# Patient Record
Sex: Female | Born: 1975 | Race: White | Hispanic: No | Marital: Married | Smoking: Former smoker
Health system: Southern US, Community
[De-identification: ages and names within clinical notes are randomized; demographics above are authoritative.]

## PROBLEM LIST (undated history)

## (undated) DIAGNOSIS — Z973 Presence of spectacles and contact lenses: Secondary | ICD-10-CM

## (undated) DIAGNOSIS — Z8489 Family history of other specified conditions: Secondary | ICD-10-CM

## (undated) DIAGNOSIS — Z789 Other specified health status: Secondary | ICD-10-CM

## (undated) DIAGNOSIS — Z860101 Personal history of adenomatous and serrated colon polyps: Secondary | ICD-10-CM

## (undated) DIAGNOSIS — Z86010 Personal history of colonic polyps: Secondary | ICD-10-CM

## (undated) DIAGNOSIS — N939 Abnormal uterine and vaginal bleeding, unspecified: Secondary | ICD-10-CM

## (undated) DIAGNOSIS — I82409 Acute embolism and thrombosis of unspecified deep veins of unspecified lower extremity: Secondary | ICD-10-CM

## (undated) HISTORY — PX: CHOLECYSTECTOMY: SHX55

---

## 2002-10-03 ENCOUNTER — Inpatient Hospital Stay (HOSPITAL_COMMUNITY): Admission: AD | Admit: 2002-10-03 | Discharge: 2002-10-05 | Payer: Self-pay | Admitting: *Deleted

## 2002-11-11 ENCOUNTER — Other Ambulatory Visit: Admission: RE | Admit: 2002-11-11 | Discharge: 2002-11-11 | Payer: Self-pay | Admitting: *Deleted

## 2003-07-30 ENCOUNTER — Encounter: Admission: RE | Admit: 2003-07-30 | Discharge: 2003-07-30 | Payer: Self-pay | Admitting: Internal Medicine

## 2003-12-29 ENCOUNTER — Other Ambulatory Visit: Admission: RE | Admit: 2003-12-29 | Discharge: 2003-12-29 | Payer: Self-pay | Admitting: Obstetrics and Gynecology

## 2005-01-03 ENCOUNTER — Other Ambulatory Visit: Admission: RE | Admit: 2005-01-03 | Discharge: 2005-01-03 | Payer: Self-pay | Admitting: Obstetrics and Gynecology

## 2008-04-21 ENCOUNTER — Encounter: Admission: RE | Admit: 2008-04-21 | Discharge: 2008-06-01 | Payer: Self-pay | Admitting: Orthopaedic Surgery

## 2010-10-06 ENCOUNTER — Other Ambulatory Visit (HOSPITAL_COMMUNITY): Payer: Self-pay | Admitting: Gastroenterology

## 2010-10-06 DIAGNOSIS — R110 Nausea: Secondary | ICD-10-CM

## 2010-10-10 ENCOUNTER — Other Ambulatory Visit: Payer: Self-pay | Admitting: Gastroenterology

## 2010-10-10 ENCOUNTER — Ambulatory Visit
Admission: RE | Admit: 2010-10-10 | Discharge: 2010-10-10 | Disposition: A | Discharge status: Home / Self Care | Payer: BC Managed Care – PPO | Source: Ambulatory Visit | Attending: Gastroenterology | Admitting: Gastroenterology

## 2010-10-10 DIAGNOSIS — R110 Nausea: Secondary | ICD-10-CM

## 2010-10-10 DIAGNOSIS — R634 Abnormal weight loss: Secondary | ICD-10-CM

## 2010-10-10 DIAGNOSIS — R109 Unspecified abdominal pain: Secondary | ICD-10-CM

## 2010-10-10 MED ORDER — IOHEXOL 300 MG/ML  SOLN
100.0000 mL | Freq: Once | INTRAMUSCULAR | Status: AC | PRN
  Administered 2010-10-10: 100 mL via INTRAVENOUS

## 2010-10-17 ENCOUNTER — Encounter (HOSPITAL_COMMUNITY)
Admission: RE | Admit: 2010-10-17 | Discharge: 2010-10-17 | Disposition: A | Discharge status: Home / Self Care | Payer: BC Managed Care – PPO | Source: Ambulatory Visit | Attending: Gastroenterology | Admitting: Gastroenterology

## 2010-10-17 DIAGNOSIS — R109 Unspecified abdominal pain: Secondary | ICD-10-CM | POA: Insufficient documentation

## 2010-10-17 DIAGNOSIS — R110 Nausea: Secondary | ICD-10-CM | POA: Insufficient documentation

## 2010-10-17 MED ORDER — TECHNETIUM TC 99M MEBROFENIN IV KIT
5.3000 | PACK | Freq: Once | INTRAVENOUS | Status: AC | PRN
  Administered 2010-10-17: 8.3 via INTRAVENOUS

## 2010-11-18 ENCOUNTER — Encounter (HOSPITAL_COMMUNITY)
Admission: RE | Admit: 2010-11-18 | Discharge: 2010-11-18 | Disposition: A | Discharge status: Home / Self Care | Payer: BC Managed Care – PPO | Source: Ambulatory Visit | Attending: General Surgery | Admitting: General Surgery

## 2010-11-18 LAB — DIFFERENTIAL
Basophils Absolute: 0.1 10*3/uL (ref 0.0–0.1)
Basophils Relative: 1 % (ref 0–1)
Eosinophils Absolute: 0.1 10*3/uL (ref 0.0–0.7)
Eosinophils Relative: 3 % (ref 0–5)
Lymphocytes Relative: 31 % (ref 12–46)
Lymphs Abs: 1.4 10*3/uL (ref 0.7–4.0)
Monocytes Absolute: 0.5 10*3/uL (ref 0.1–1.0)
Monocytes Relative: 10 % (ref 3–12)
Neutro Abs: 2.6 10*3/uL (ref 1.7–7.7)
Neutrophils Relative %: 56 % (ref 43–77)

## 2010-11-18 LAB — COMPREHENSIVE METABOLIC PANEL
ALT: 8 U/L (ref 0–35)
AST: 17 U/L (ref 0–37)
Albumin: 4.1 g/dL (ref 3.5–5.2)
Alkaline Phosphatase: 37 U/L — ABNORMAL LOW (ref 39–117)
BUN: 11 mg/dL (ref 6–23)
CO2: 27 mEq/L (ref 19–32)
Calcium: 9.9 mg/dL (ref 8.4–10.5)
Chloride: 104 mEq/L (ref 96–112)
Creatinine, Ser: 0.64 mg/dL (ref 0.4–1.2)
GFR calc Af Amer: >60 mL/min (ref >60)
GFR calc non Af Amer: >60 mL/min (ref >60)
Glucose, Bld: 84 mg/dL (ref 70–99)
Potassium: 4.4 mEq/L (ref 3.5–5.1)
Sodium: 139 mEq/L (ref 135–145)
Total Bilirubin: 1 mg/dL (ref 0.3–1.2)
Total Protein: 6.9 g/dL (ref 6.0–8.3)

## 2010-11-18 LAB — CBC
HCT: 38.5 % (ref 36.0–46.0)
Hemoglobin: 13.2 g/dL (ref 12.0–15.0)
MCH: 29.6 pg (ref 26.0–34.0)
MCHC: 34.3 g/dL (ref 30.0–36.0)
MCV: 86.3 fL (ref 78.0–100.0)
Platelets: 147 10*3/uL — ABNORMAL LOW (ref 150–400)
RBC: 4.46 MIL/uL (ref 3.87–5.11)
RDW: 12.5 % (ref 11.5–15.5)
WBC: 4.6 10*3/uL (ref 4.0–10.5)

## 2010-11-18 LAB — SURGICAL PCR SCREEN
MRSA, PCR: NEGATIVE
Staphylococcus aureus: NEGATIVE

## 2010-11-18 LAB — HCG, SERUM, QUALITATIVE: Preg, Serum: NEGATIVE

## 2010-11-22 ENCOUNTER — Other Ambulatory Visit: Payer: Self-pay | Admitting: General Surgery

## 2010-11-22 ENCOUNTER — Ambulatory Visit (HOSPITAL_COMMUNITY): Payer: BC Managed Care – PPO

## 2010-11-22 ENCOUNTER — Ambulatory Visit (HOSPITAL_COMMUNITY)
Admission: RE | Admit: 2010-11-22 | Discharge: 2010-11-22 | Disposition: A | Discharge status: Home / Self Care | Payer: BC Managed Care – PPO | Source: Ambulatory Visit | Attending: General Surgery | Admitting: General Surgery

## 2010-11-22 DIAGNOSIS — K824 Cholesterolosis of gallbladder: Secondary | ICD-10-CM | POA: Insufficient documentation

## 2010-11-22 DIAGNOSIS — Z01818 Encounter for other preprocedural examination: Secondary | ICD-10-CM | POA: Insufficient documentation

## 2010-11-22 DIAGNOSIS — Z87891 Personal history of nicotine dependence: Secondary | ICD-10-CM | POA: Insufficient documentation

## 2010-11-22 HISTORY — PX: CHOLECYSTECTOMY, LAPAROSCOPIC: SHX56

## 2010-11-29 NOTE — Op Note (Signed)
NAME:  Kathy Wu, Kathy Wu NO.:  [id number]  MEDICAL RECORD NO.:  [id number]  LOCATION:  [facility]                         FACILITY:  [facility]  PHYSICIAN:  Cherylynn Ridges, M.D.    DATE OF BIRTH:  02/04/1976  DATE OF PROCEDURE:  11/22/2010 DATE OF DISCHARGE:                              OPERATIVE REPORT   PREOPERATIVE DIAGNOSIS:  Gallbladder polyp with symptoms.  POSTOPERATIVE DIAGNOSIS:  Gallbladder polyp with symptoms.  PROCEDURE:  Laparoscopic cholecystectomy with cholangiogram.  SURGEON:  Cherylynn Ridges, MD  ANESTHESIA:  General endotracheal.  ESTIMATED BLOOD LOSS:  Less than 10 mL.  COMPLICATIONS:  None.  CONDITION:  Stable.  FINDINGS:  The patient had a normal intraoperative cholangiogram with some evidence of mild cholecystitis.  INDICATIONS FOR OPERATION:  The patient is a 35 year old with an ultrasound demonstrating a gallbladder polyp, symptoms of right upper quadrant pain not really related to the polyp it seems like but a gallbladder polyp noted on the ultrasound who now comes in for an elective laparoscopic cholecystectomy.  OPERATION:  The patient was taken to the operating room, placed on table in supine position.  After an adequate general endotracheal anesthetic was administered, she was prepped and draped in the usual sterile manner exposing the entire abdomen.  After a proper time-out was performed identifying the patient and the procedure to be performed an infraumbilical midline incision was made using #15 blade and taken down to the midline fascia.  The midline fascia with grabbed with Kocher clamps and we incised between the Kocher clamps using 15 blade into the preperitoneal space.  We grabbed the edges of the fascia as we bluntly dissected into the peritoneal cavity using Kelly clamp.  Once we were in the free peritoneal cavity a pursestring suture of 0 Vicryl was passed around the fascial opening, then a Hasson cannula passed into the  peritoneal cavity.  The Hasson cannula was secured in place using the pursestring suture which was in place.  We then insufflated carbon dioxide gas up to a maximal intra- abdominal pressure of 15 mmHg.  The patient was placed in reverse Trendelenburg position.  The left-side was tilted down.  To right costal margin 5-mm cannula and a subxiphoid 5- mm cannula were passed under direct vision.  Once all cannulae were placed the dissection was begun.  As we retracted on the dome of the gallbladder using a ratcheted grasper through the lateral-most cannula towards the anterior abdominal wall in right upper quadrant some filmy adhesions were noted to the midportion and infundibulum of the gallbladder which were dissected away using electrocautery and blunt dissection.  We got down to the peritoneum overlying the triangle of Calot and hepatoduodenal triangle and it was in those structures that we dissected out the cystic duct and the cystic artery.  The cystic duct was clipped along the gallbladder side.  A cholecystodochotomy made using laparoscopic scissors and then a cholangiogram performed using a Cook catheter which had been passed through the anterior abdominal wall.  The cholangiogram showed good flow into the duodenum, small common bile duct, good proximal filling and no obstructive process with no intraductal filling defects.  Once the cholangiogram  was completed, we removed the clip securing the catheter in place, removed the cath and then clipped the distal cystic duct x3 and transected it.  We then dissected out the cystic artery both anterior and posterior branches, clipped them proximally and distally, transected them, then dissected out the gallbladder from its bed with minimal difficulty.  We retrieved the gallbladder from the infraumbilical fascial site without an EndoCatch bag.  There was no leakage or spillage.  Once the gallbladder was retrieved, we irrigated the bed,  saw there was minimal bleeding and no bile staining.  We aspirated all fluid and gas from above the liver, then removed all cannulas.  The infraumbilical fascial site was closed using a pursestring suture which was in place.  We injected 1% Marcaine with epi at all sites.  We closed the skin at the umbilicus using running subcuticular stitch of 4- 0 Monocryl.  Dermabond, Steri-Strips and Tegaderm were used to complete all dressings.  All needle counts, sponge counts and instrument counts were correct.      Cherylynn Ridges, M.D.     JOW/MEDQ  D:  11/22/2010  T:  11/22/2010  Job:  [id number]  Electronically Signed by Jimmye Norman M.D. on 11/29/2010 08:19:46 AM

## 2010-12-08 ENCOUNTER — Encounter (INDEPENDENT_AMBULATORY_CARE_PROVIDER_SITE_OTHER): Payer: Self-pay | Admitting: General Surgery

## 2010-12-08 ENCOUNTER — Ambulatory Visit (INDEPENDENT_AMBULATORY_CARE_PROVIDER_SITE_OTHER): Payer: BC Managed Care – PPO | Admitting: General Surgery

## 2010-12-08 DIAGNOSIS — K811 Chronic cholecystitis: Secondary | ICD-10-CM

## 2010-12-08 NOTE — Progress Notes (Signed)
Subjective:     Patient ID: Kathy Wu, female   DOB: 1976-05-14, 35 y.o.   MRN: [id number]    There were no vitals taken for this visit.    HPI The patient is status post laparoscopic cholecystectomy doing well. She initially had difficulty eating habits to down a clear liquid diet for several day but eventually began to eat normally. She did develop some abdominal bloating subsequent which has subsequently resolved. Review of Systems Negative   Objective:   Physical Exam Peri0umbilical port site ecchymosis, no hernia.  Other sites okay.   Assessment:     Doing well postoperative S/P Lap. cholecystectomy    Plan:     No need for further followup Advance diet as tolerated.

## 2012-07-09 NOTE — H&P (Signed)
Kathy Wu  DICTATION # [id number] CSN# [id number]   Meriel Pica, MD 07/09/2012 9:06 AM

## 2012-07-09 NOTE — H&P (Signed)
NAME:  Kathy Wu, Kathy Wu NO.:  [id number]  MEDICAL RECORD NO.:  [id number]  LOCATION:  [facility]                         FACILITY:  [facility]  PHYSICIAN:  Duke Salvia. Marcelle Overlie, M.D.DATE OF BIRTH:  05/20/1976  DATE OF ADMISSION:  06/11/2012 DATE OF DISCHARGE:                             HISTORY & PHYSICAL   CHIEF COMPLAINT:  Pelvic pain/dyspareunia.  Right lower quadrant pain.  HISTORY OF PRESENT ILLNESS:  A 37 year old, G2, P1 having regular cycles, currently using condoms for contraception, presents with a 6-12 month history of right lower quadrant pain that is intermittently worse associated with dyspareunia.  Ultrasound in our office April 29, 2012 showed normal findings.  Her pain is worsened to the point that she desires to proceed with DL for further evaluation.  This procedure including risks related to bleeding, infection, other complications may require open additional surgery discussed with her which she understands and accepts.  PAST MEDICAL HISTORY:  ALLERGIES:  None.  CURRENT MEDICATIONS:  Motrin p.r.n. pain.  REVIEW OF SYSTEMS:  Significant for prior Pap.  She has had cryo of her Pap done, last Pap dated April 24, 2012 was normal.  OTHER SURGERIES:  Cholecystectomy in 2012, she has had a vaginal delivery in 2004.  FAMILY HISTORY:  Otherwise, unremarkable.  SOCIAL HISTORY:  Denies tobacco or drug use.  She does have 1 alcoholic drink per day.  She is married.  PHYSICAL EXAMINATION:  VITAL SIGNS:  Temperature 98.2, blood pressure 128/72. HEENT:  Unremarkable. NECK:  Supple without masses. LUNGS:  Clear. CARDIOVASCULAR:  Regular rate and rhythm without murmurs, rubs, or gallops noted. BREASTS:  Without masses. ABDOMEN:  Soft, flat, nontender. PELVIC:  Normal external genitalia.  Vagina and cervix clear.  Uterus, mid position, normal size, mobile, no unusual nodularity or tenderness. Slightly tender on the right.  No definite masses or  nodularity.  Left adnexa unremarkable. EXTREMITIES:  Unremarkable. NEUROLOGIC:  Unremarkable.  IMPRESSION:  Chronic pelvic pain, right lower quadrant pain, dyspareunia, rule out endometriosis, adhesions, or other causes for pain.  PLAN:  Diagnostic laparoscopy.  Procedure and risks reviewed as above.     Richard M. Marcelle Overlie, M.D.     [facility]/MEDQ  D:  07/09/2012  T:  07/09/2012  Job:  [id number]

## 2012-07-10 ENCOUNTER — Encounter (HOSPITAL_COMMUNITY): Payer: Self-pay | Admitting: Pharmacist

## 2012-07-11 ENCOUNTER — Encounter (HOSPITAL_COMMUNITY): Payer: Self-pay

## 2012-07-11 ENCOUNTER — Encounter (HOSPITAL_COMMUNITY)
Admission: RE | Admit: 2012-07-11 | Discharge: 2012-07-11 | Disposition: A | Discharge status: Home / Self Care | Payer: BC Managed Care – PPO | Source: Ambulatory Visit | Attending: Obstetrics and Gynecology | Admitting: Obstetrics and Gynecology

## 2012-07-11 HISTORY — DX: Other specified health status: Z78.9

## 2012-07-11 LAB — CBC
HCT: 41.5 % (ref 36.0–46.0)
Hemoglobin: 13.4 g/dL (ref 12.0–15.0)
MCH: 29.1 pg (ref 26.0–34.0)
MCHC: 32.3 g/dL (ref 30.0–36.0)
MCV: 90 fL (ref 78.0–100.0)
Platelets: 152 10*3/uL (ref 150–400)
RBC: 4.61 MIL/uL (ref 3.87–5.11)
RDW: 12.6 % (ref 11.5–15.5)
WBC: 4.7 10*3/uL (ref 4.0–10.5)

## 2012-07-11 LAB — SURGICAL PCR SCREEN
MRSA, PCR: NEGATIVE
Staphylococcus aureus: NEGATIVE

## 2012-07-11 NOTE — Patient Instructions (Addendum)
20 Kathy Wu  07/11/2012   Your procedure is scheduled on:  07/19/12  Enter through the Main Entrance of [facility] at 6 AM.  Pick up the phone at the desk and dial [phone].   Call this number if you have problems the morning of surgery: [phone]   Remember:   Do not eat food:After Midnight.  Do not drink clear liquids: After Midnight.  Take these medicines the morning of surgery with A SIP OF WATER: NA   Do not wear jewelry, make-up or nail polish.  Do not wear lotions, powders, or perfumes. You may wear deodorant.  Do not shave 48 hours prior to surgery.  Do not bring valuables to the hospital.  Contacts, dentures or bridgework may not be worn into surgery.  Leave suitcase in the car. After surgery it may be brought to your room.  For patients admitted to the hospital, checkout time is 11:00 AM the day of discharge.   Patients discharged the day of surgery will not be allowed to drive home.  Name and phone number of your driver: [name]  Husband  Special Instructions: Shower using CHG 2 nights before surgery and the night before surgery.  If you shower the day of surgery use CHG.  Use special wash - you have one bottle of CHG for all showers.  You should use approximately 1/3 of the bottle for each shower.   Please read over the following fact sheets that you were given: Surgical Site Infection Prevention

## 2012-07-13 DIAGNOSIS — Z86718 Personal history of other venous thrombosis and embolism: Secondary | ICD-10-CM

## 2012-07-13 HISTORY — DX: Personal history of other venous thrombosis and embolism: Z86.718

## 2012-07-19 ENCOUNTER — Encounter (HOSPITAL_COMMUNITY): Payer: Self-pay | Admitting: Anesthesiology

## 2012-07-19 ENCOUNTER — Ambulatory Visit (HOSPITAL_COMMUNITY)
Admission: RE | Admit: 2012-07-19 | Discharge: 2012-07-19 | Disposition: A | Discharge status: Home / Self Care | Payer: BC Managed Care – PPO | Source: Ambulatory Visit | Attending: Obstetrics and Gynecology | Admitting: Obstetrics and Gynecology

## 2012-07-19 ENCOUNTER — Encounter (HOSPITAL_COMMUNITY)
Admission: RE | Disposition: A | Discharge status: Home / Self Care | Payer: Self-pay | Source: Ambulatory Visit | Attending: Obstetrics and Gynecology

## 2012-07-19 ENCOUNTER — Ambulatory Visit (HOSPITAL_COMMUNITY): Payer: BC Managed Care – PPO | Admitting: Anesthesiology

## 2012-07-19 DIAGNOSIS — IMO0002 Reserved for concepts with insufficient information to code with codable children: Secondary | ICD-10-CM | POA: Insufficient documentation

## 2012-07-19 DIAGNOSIS — N949 Unspecified condition associated with female genital organs and menstrual cycle: Secondary | ICD-10-CM | POA: Insufficient documentation

## 2012-07-19 DIAGNOSIS — R1031 Right lower quadrant pain: Secondary | ICD-10-CM | POA: Insufficient documentation

## 2012-07-19 HISTORY — PX: LAPAROSCOPY: SHX197

## 2012-07-19 LAB — PREGNANCY, URINE: Preg Test, Ur: NEGATIVE

## 2012-07-19 SURGERY — LAPAROSCOPY, DIAGNOSTIC
Anesthesia: General | Site: Abdomen | Wound class: Clean

## 2012-07-19 MED ORDER — GLYCOPYRROLATE 0.2 MG/ML IJ SOLN
INTRAMUSCULAR | Status: AC
  Filled 2012-07-19: qty 3

## 2012-07-19 MED ORDER — PROPOFOL 10 MG/ML IV EMUL
INTRAVENOUS | Status: DC | PRN
  Administered 2012-07-19: 150 mg via INTRAVENOUS

## 2012-07-19 MED ORDER — SCOPOLAMINE 1 MG/3DAYS TD PT72
1.0000 | MEDICATED_PATCH | TRANSDERMAL | Status: DC
  Administered 2012-07-19: 1.5 mg via TRANSDERMAL

## 2012-07-19 MED ORDER — ONDANSETRON HCL 4 MG/2ML IJ SOLN
INTRAMUSCULAR | Status: AC
  Filled 2012-07-19: qty 2

## 2012-07-19 MED ORDER — FENTANYL CITRATE 0.05 MG/ML IJ SOLN
INTRAMUSCULAR | Status: AC
  Filled 2012-07-19: qty 5

## 2012-07-19 MED ORDER — MIDAZOLAM HCL 5 MG/5ML IJ SOLN
INTRAMUSCULAR | Status: DC | PRN
  Administered 2012-07-19: 2 mg via INTRAVENOUS

## 2012-07-19 MED ORDER — NEOSTIGMINE METHYLSULFATE 1 MG/ML IJ SOLN
INTRAMUSCULAR | Status: DC | PRN
  Administered 2012-07-19: 3 mg via INTRAVENOUS

## 2012-07-19 MED ORDER — ACETAMINOPHEN 10 MG/ML IV SOLN: 1000.0000 mg | Freq: Once | INTRAVENOUS | Status: DC

## 2012-07-19 MED ORDER — KETOROLAC TROMETHAMINE 30 MG/ML IJ SOLN
INTRAMUSCULAR | Status: DC | PRN
  Administered 2012-07-19: 30 mg via INTRAVENOUS

## 2012-07-19 MED ORDER — BUPIVACAINE HCL (PF) 0.25 % IJ SOLN
INTRAMUSCULAR | Status: DC | PRN
  Administered 2012-07-19: 8 mL

## 2012-07-19 MED ORDER — KETOROLAC TROMETHAMINE 30 MG/ML IJ SOLN: 15.0000 mg | Freq: Once | INTRAMUSCULAR | Status: DC | PRN

## 2012-07-19 MED ORDER — OXYCODONE-ACETAMINOPHEN 5-325 MG PO TABS: 1.0000 | ORAL_TABLET | ORAL | Status: DC | PRN

## 2012-07-19 MED ORDER — PROMETHAZINE HCL 25 MG/ML IJ SOLN: 6.2500 mg | INTRAMUSCULAR | Status: DC | PRN

## 2012-07-19 MED ORDER — DEXAMETHASONE SODIUM PHOSPHATE 10 MG/ML IJ SOLN
INTRAMUSCULAR | Status: AC
  Filled 2012-07-19: qty 1

## 2012-07-19 MED ORDER — BUPIVACAINE HCL (PF) 0.25 % IJ SOLN
INTRAMUSCULAR | Status: AC
  Filled 2012-07-19: qty 30

## 2012-07-19 MED ORDER — SCOPOLAMINE 1 MG/3DAYS TD PT72
MEDICATED_PATCH | TRANSDERMAL | Status: AC
  Administered 2012-07-19: 1.5 mg via TRANSDERMAL
  Filled 2012-07-19: qty 1

## 2012-07-19 MED ORDER — ONDANSETRON HCL 4 MG/2ML IJ SOLN
INTRAMUSCULAR | Status: DC | PRN
  Administered 2012-07-19: 4 mg via INTRAVENOUS

## 2012-07-19 MED ORDER — FENTANYL CITRATE 0.05 MG/ML IJ SOLN: 25.0000 ug | INTRAMUSCULAR | Status: DC | PRN

## 2012-07-19 MED ORDER — DEXAMETHASONE SODIUM PHOSPHATE 4 MG/ML IJ SOLN
INTRAMUSCULAR | Status: DC | PRN
  Administered 2012-07-19: 8 mg via INTRAVENOUS

## 2012-07-19 MED ORDER — NEOSTIGMINE METHYLSULFATE 1 MG/ML IJ SOLN
INTRAMUSCULAR | Status: AC
  Filled 2012-07-19: qty 1

## 2012-07-19 MED ORDER — MIDAZOLAM HCL 2 MG/2ML IJ SOLN
INTRAMUSCULAR | Status: AC
  Filled 2012-07-19: qty 2

## 2012-07-19 MED ORDER — GLYCOPYRROLATE 0.2 MG/ML IJ SOLN
INTRAMUSCULAR | Status: DC | PRN
  Administered 2012-07-19: 0.6 mg via INTRAVENOUS

## 2012-07-19 MED ORDER — MEPERIDINE HCL 25 MG/ML IJ SOLN: 6.2500 mg | INTRAMUSCULAR | Status: DC | PRN

## 2012-07-19 MED ORDER — ROCURONIUM BROMIDE 100 MG/10ML IV SOLN
INTRAVENOUS | Status: DC | PRN
  Administered 2012-07-19: 25 mg via INTRAVENOUS

## 2012-07-19 MED ORDER — LIDOCAINE HCL (CARDIAC) 20 MG/ML IV SOLN
INTRAVENOUS | Status: AC
  Filled 2012-07-19: qty 5

## 2012-07-19 MED ORDER — LACTATED RINGERS IV SOLN
INTRAVENOUS | Status: DC
  Administered 2012-07-19 (×2): via INTRAVENOUS

## 2012-07-19 MED ORDER — ROCURONIUM BROMIDE 50 MG/5ML IV SOLN
INTRAVENOUS | Status: AC
  Filled 2012-07-19: qty 1

## 2012-07-19 MED ORDER — MIDAZOLAM HCL 2 MG/2ML IJ SOLN: 0.5000 mg | Freq: Once | INTRAMUSCULAR | Status: DC | PRN

## 2012-07-19 MED ORDER — PROPOFOL 10 MG/ML IV EMUL
INTRAVENOUS | Status: AC
  Filled 2012-07-19: qty 20

## 2012-07-19 MED ORDER — ACETAMINOPHEN 10 MG/ML IV SOLN
INTRAVENOUS | Status: AC
  Administered 2012-07-19: 1000 mg via INTRAVENOUS
  Filled 2012-07-19: qty 100

## 2012-07-19 MED ORDER — LIDOCAINE HCL (CARDIAC) 20 MG/ML IV SOLN
INTRAVENOUS | Status: DC | PRN
  Administered 2012-07-19: 50 mg via INTRAVENOUS

## 2012-07-19 MED ORDER — SODIUM CHLORIDE 0.9 % IJ SOLN
INTRAMUSCULAR | Status: DC | PRN
  Administered 2012-07-19: 10 mL

## 2012-07-19 MED ORDER — FENTANYL CITRATE 0.05 MG/ML IJ SOLN
INTRAMUSCULAR | Status: DC | PRN
  Administered 2012-07-19: 100 ug via INTRAVENOUS

## 2012-07-19 SURGICAL SUPPLY — 26 items
CABLE HIGH FREQUENCY MONO STRZ (ELECTRODE) IMPLANT
CATH ROBINSON RED A/P 16FR (CATHETERS) ×3 IMPLANT
CLOTH BEACON ORANGE TIMEOUT ST (SAFETY) ×3 IMPLANT
DERMABOND ADVANCED (GAUZE/BANDAGES/DRESSINGS) ×1
DERMABOND ADVANCED .7 DNX12 (GAUZE/BANDAGES/DRESSINGS) ×2 IMPLANT
GLOVE BIO SURGEON STRL SZ7 (GLOVE) ×6 IMPLANT
GLOVE BIOGEL PI IND STRL 6.5 (GLOVE) IMPLANT
GLOVE BIOGEL PI INDICATOR 6.5 (GLOVE)
GOWN PREVENTION PLUS LG XLONG (DISPOSABLE) ×6 IMPLANT
NEEDLE INSUFFLATION 120MM (ENDOMECHANICALS) ×3 IMPLANT
NS IRRIG 1000ML POUR BTL (IV SOLUTION) ×3 IMPLANT
PACK LAPAROSCOPY BASIN (CUSTOM PROCEDURE TRAY) ×3 IMPLANT
POUCH SPECIMEN RETRIEVAL 10MM (ENDOMECHANICALS) IMPLANT
PROTECTOR NERVE ULNAR (MISCELLANEOUS) ×3 IMPLANT
SEALER TISSUE G2 CVD JAW 35 (ENDOMECHANICALS) IMPLANT
SEALER TISSUE G2 CVD JAW 45CM (ENDOMECHANICALS) IMPLANT
SET IRRIG TUBING LAPAROSCOPIC (IRRIGATION / IRRIGATOR) IMPLANT
SUT MON AB 2-0 CT1 36 (SUTURE) ×9 IMPLANT
SUT VIC AB 2-0 CT1 18 (SUTURE) ×6 IMPLANT
SUT VICRYL 0 TIES 12 18 (SUTURE) ×3 IMPLANT
SUT VICRYL RAPIDE 4/0 PS 2 (SUTURE) ×3 IMPLANT
TOWEL OR 17X24 6PK STRL BLUE (TOWEL DISPOSABLE) ×6 IMPLANT
TROCAR XCEL DIL TIP R 11M (ENDOMECHANICALS) IMPLANT
TROCAR Z-THREAD BLADED 5X100MM (TROCAR) ×6 IMPLANT
WARMER LAPAROSCOPE (MISCELLANEOUS) ×3 IMPLANT
WATER STERILE IRR 1000ML POUR (IV SOLUTION) IMPLANT

## 2012-07-19 NOTE — Anesthesia Preprocedure Evaluation (Signed)
Anesthesia Evaluation  Patient identified by MRN, date of birth, ID band Patient awake    Reviewed: Allergy & Precautions, H&P , Patient's Chart, lab work & pertinent test results, reviewed documented beta blocker date and time   History of Anesthesia Complications Negative for: history of anesthetic complications  Airway Mallampati: II TM Distance: >3 FB Neck ROM: full    Dental No notable dental hx.    Pulmonary neg pulmonary ROS,  breath sounds clear to auscultation  Pulmonary exam normal       Cardiovascular Exercise Tolerance: Good negative cardio ROS  Rhythm:regular Rate:Normal     Neuro/Psych negative neurological ROS  negative psych ROS   GI/Hepatic negative GI ROS, Neg liver ROS,   Endo/Other  negative endocrine ROS  Renal/GU negative Renal ROS     Musculoskeletal   Abdominal   Peds  Hematology negative hematology ROS (+)   Anesthesia Other Findings   Reproductive/Obstetrics negative OB ROS                           Anesthesia Physical Anesthesia Plan  ASA: I  Anesthesia Plan: General ETT   Post-op Pain Management:    Induction:   Airway Management Planned:   Additional Equipment:   Intra-op Plan:   Post-operative Plan:   Informed Consent: I have reviewed the patients History and Physical, chart, labs and discussed the procedure including the risks, benefits and alternatives for the proposed anesthesia with the patient or authorized representative who has indicated his/her understanding and acceptance.   Dental Advisory Given  Plan Discussed with: CRNA and Surgeon  Anesthesia Plan Comments:        Anesthesia Quick Evaluation  

## 2012-07-19 NOTE — Op Note (Signed)
Preoperative diagnosis: Chronic pelvic pain, right lower quadrant  Postoperative diagnosis: Same  Procedure: Diagnostic laparoscopy  Surgeon: Marcelle Overlie  EBL: Less than 10 cc  Specimens removed: None  Procedure and findings:  The patient taken the operating room after an adequate level of general endotracheal anesthesia was obtained the legs in stirrups the abdomen, perineum and vagina were prepped and draped in usual fashion for laparoscopy the bladder was drained, Hulka tenaculum was positioned. Appropriate timeout for taken part of this point.    Attention directed to the subumbilical area where the tissue was infiltrated with quarter some Marcaine plain small incision was made in the varies needle was introduced that difficulty. Its intra-abdominal position was verified by pressure water testing. After 2-1/2 L pneumoperitoneum syncopated lap scopic trocar and sleeve were then introduced that difficulty this is a 5 mm trocar. The patient in Trendelenburg 3 finger breaths above the symphysis in the midline and another 5 mm trocar was inserted under direct visualization a blunt probe was used. Pelvic findings as follows  The anterior posterior cul-de-sac were free and clear the uterus itself was normal size the serosa appeared to be normal bilateral adnexa unremarkable the appendix and upper abdomen were otherwise normal specifically careful inspection of all peritoneum did not reveal any early stage endometriosis the course of ureters on each side appeared be normal no other obvious explanation for her right lower quadrant pain. Careful inspection the appendix revealed it to be a nonadherent and otherwise appeared normal after these findings for further documented instruments removed removed gas allowed to escape defect closed with 4 Dexon subcuticular sutures and Dermabond she tolerated this well and went to recovery room in good condition.  Dictated with [facility]  Richard M. Milana Obey.D.

## 2012-07-19 NOTE — Transfer of Care (Signed)
Immediate Anesthesia Transfer of Care Note  Patient: Kathy Wu  Procedure(s) Performed: Procedure(s) (LRB) with comments: LAPAROSCOPY DIAGNOSTIC (N/A)  Patient Location: PACU  Anesthesia Type:General  Level of Consciousness: awake, alert  and oriented  Airway & Oxygen Therapy: Patient Spontanous Breathing and Patient connected to nasal cannula oxygen  Post-op Assessment: Report given to PACU RN and Post -op Vital signs reviewed and stable  Post vital signs: stable  Complications: No apparent anesthesia complications

## 2012-07-19 NOTE — Anesthesia Postprocedure Evaluation (Signed)
Anesthesia Post Note  Patient: Kathy Wu  Procedure(s) Performed: Procedure(s) (LRB): LAPAROSCOPY DIAGNOSTIC (N/A)  Anesthesia type: General  Patient location: PACU  Post pain: Pain level controlled  Post assessment: Post-op Vital signs reviewed  Last Vitals:  Filed Vitals:   07/19/12 0900  BP: 99/54  Pulse: 74  Temp: 37.1 C  Resp: 16    Post vital signs: Reviewed  Level of consciousness: sedated  Complications: No apparent anesthesia complications

## 2012-07-19 NOTE — Progress Notes (Signed)
The patient was re-examined with no change in status 

## 2012-07-21 ENCOUNTER — Encounter (HOSPITAL_COMMUNITY): Payer: Self-pay | Admitting: Anesthesiology

## 2012-07-21 ENCOUNTER — Inpatient Hospital Stay (EMERGENCY_DEPARTMENT_HOSPITAL)
Admission: AD | Admit: 2012-07-21 | Discharge: 2012-07-21 | Disposition: A | Discharge status: Hospice | Payer: BC Managed Care – PPO | Source: Ambulatory Visit | Attending: Obstetrics and Gynecology | Admitting: Obstetrics and Gynecology

## 2012-07-21 ENCOUNTER — Inpatient Hospital Stay (HOSPITAL_COMMUNITY)
Admission: EM | Admit: 2012-07-21 | Discharge: 2012-07-25 | DRG: 560 | Disposition: A | Discharge status: Home / Self Care | Payer: BC Managed Care – PPO | Attending: Internal Medicine | Admitting: Internal Medicine

## 2012-07-21 ENCOUNTER — Encounter (HOSPITAL_COMMUNITY): Payer: Self-pay | Admitting: Nurse Practitioner

## 2012-07-21 DIAGNOSIS — H538 Other visual disturbances: Secondary | ICD-10-CM | POA: Diagnosis present

## 2012-07-21 DIAGNOSIS — R1031 Right lower quadrant pain: Secondary | ICD-10-CM | POA: Diagnosis present

## 2012-07-21 DIAGNOSIS — M6281 Muscle weakness (generalized): Secondary | ICD-10-CM

## 2012-07-21 DIAGNOSIS — I81 Portal vein thrombosis: Secondary | ICD-10-CM | POA: Diagnosis present

## 2012-07-21 DIAGNOSIS — K269 Duodenal ulcer, unspecified as acute or chronic, without hemorrhage or perforation: Secondary | ICD-10-CM | POA: Diagnosis present

## 2012-07-21 DIAGNOSIS — R29898 Other symptoms and signs involving the musculoskeletal system: Principal | ICD-10-CM

## 2012-07-21 LAB — COMPREHENSIVE METABOLIC PANEL
ALT: 8 U/L (ref 0–35)
AST: 15 U/L (ref 0–37)
Albumin: 3.7 g/dL (ref 3.5–5.2)
Alkaline Phosphatase: 30 U/L — ABNORMAL LOW (ref 39–117)
BUN: 13 mg/dL (ref 6–23)
CO2: 28 mEq/L (ref 19–32)
Calcium: 9.1 mg/dL (ref 8.4–10.5)
Chloride: 102 mEq/L (ref 96–112)
Creatinine, Ser: 0.65 mg/dL (ref 0.50–1.10)
GFR calc Af Amer: >90 mL/min (ref >90)
GFR calc non Af Amer: >90 mL/min (ref >90)
Glucose, Bld: 72 mg/dL (ref 70–99)
Potassium: 3.7 mEq/L (ref 3.5–5.1)
Sodium: 140 mEq/L (ref 135–145)
Total Bilirubin: 0.5 mg/dL (ref 0.3–1.2)
Total Protein: 6.8 g/dL (ref 6.0–8.3)

## 2012-07-21 LAB — URINALYSIS, ROUTINE W REFLEX MICROSCOPIC
Glucose, UA: NEGATIVE mg/dL
Hgb urine dipstick: NEGATIVE
Ketones, ur: 40 mg/dL — AB
Leukocytes, UA: NEGATIVE
Nitrite: NEGATIVE
Protein, ur: NEGATIVE mg/dL
Specific Gravity, Urine: 1.025 (ref 1.005–1.030)
Urobilinogen, UA: 0.2 mg/dL (ref 0.0–1.0)
pH: 6 (ref 5.0–8.0)

## 2012-07-21 LAB — CBC WITH DIFFERENTIAL/PLATELET
Basophils Absolute: 0 10*3/uL (ref 0.0–0.1)
Basophils Relative: 1 % (ref 0–1)
Eosinophils Absolute: 0.1 10*3/uL (ref 0.0–0.7)
Eosinophils Relative: 2 % (ref 0–5)
HCT: 36.8 % (ref 36.0–46.0)
Hemoglobin: 12.6 g/dL (ref 12.0–15.0)
Lymphocytes Relative: 25 % (ref 12–46)
Lymphs Abs: 1.7 10*3/uL (ref 0.7–4.0)
MCH: 29.8 pg (ref 26.0–34.0)
MCHC: 34.2 g/dL (ref 30.0–36.0)
MCV: 87 fL (ref 78.0–100.0)
Monocytes Absolute: 0.5 10*3/uL (ref 0.1–1.0)
Monocytes Relative: 7 % (ref 3–12)
Neutro Abs: 4.4 10*3/uL (ref 1.7–7.7)
Neutrophils Relative %: 65 % (ref 43–77)
Platelets: 144 10*3/uL — ABNORMAL LOW (ref 150–400)
RBC: 4.23 MIL/uL (ref 3.87–5.11)
RDW: 12.1 % (ref 11.5–15.5)
WBC: 6.7 10*3/uL (ref 4.0–10.5)

## 2012-07-21 MED ORDER — OXYCODONE-ACETAMINOPHEN 5-325 MG PO TABS
1.0000 | ORAL_TABLET | ORAL | Status: DC | PRN
  Administered 2012-07-21: 1 via ORAL
  Filled 2012-07-21: qty 1

## 2012-07-21 NOTE — MAU Note (Signed)
States she had the scopalamine patch on and thought the dizziness was from that so she removed it Saturday.  States she has to lift her legs up to walk.  Denies headaches, reports nausea.

## 2012-07-21 NOTE — ED Notes (Signed)
Patient currently resting quietly in bed; no respiratory or acute distress noted.  Patient updated on plan of care; informed patient that we are currently waiting on lab results to come back.  Patient denies any other needs at this time; will continue to monitor.

## 2012-07-21 NOTE — ED Notes (Addendum)
Patient currently sitting up in bed; no respiratory or acute distress noted.  Patient pending EDP exam at this time.  Patient given warm blanket, per request.  Denies any other needs at this time; will continue to monitor.

## 2012-07-21 NOTE — ED Notes (Signed)
Neurologist currently at bedside

## 2012-07-21 NOTE — H&P (Signed)
[facility] History and Physical  Kathy Wu WUX:[id number] DOB: Oct 27, 1975 DOA: 07/21/2012  Referring physician: ED PCP: Provider Not In System  Specialists: Jerene Bears  Chief Complaint: Weakness, blurry vision  HPI: Kathy Wu is a 37 y.o. female who presents with c/o BLE weakness and blurry vision.  Symptoms onset after an ex-lap when waking up from sedation on Friday.  She describes severe BLE weakness located in her lower legs, and vision more blurry than baseline.  In the ED neurology was consulted and saw the patient, they were mostly unimpressed but did want to get an MRI of her C and T spine to rule out neuromyelitis optica but they did not feel that this needed to be done emergently.  Because [facility] does not do non-emergent MRIs at night, admitting patient to obs to check MRI in AM.  Review of Systems: 12 systems reviewed and otherwise negative.  Past Medical History  Diagnosis Date  . No pertinent past medical history    Past Surgical History  Procedure Laterality Date  . Cholecystectomy     Social History:  reports that she quit smoking about 1 years ago. Her smoking use included Cigarettes. She smoked 0.00 packs per day for 5 years. She does not have any smokeless tobacco history on file. She reports that  drinks alcohol. She reports that she does not use illicit drugs.   No Known Allergies  Family History  Problem Relation Age of Onset  . Hypertension Mother     Prior to Admission medications   Medication Sig Start Date End Date Taking? Authorizing Provider  oxyCODONE-acetaminophen (ROXICET) 5-325 MG per tablet Take 1 tablet by mouth every 4 (four) hours as needed for pain. 07/19/12  Yes Meriel Pica, MD   Physical Exam: Filed Vitals:   07/21/12 1912 07/21/12 2130 07/21/12 2215  BP: 111/75 111/76 109/69  Pulse: 81 76 74  Temp: 98.4 F (36.9 C) 98.6 F (37 C)   TempSrc: Oral Oral   Resp: 16 16 16   SpO2: 100% 98% 98%    General:  NAD, resting  comfortably in bed Eyes: PEERLA EOMI ENT: mucous membranes moist Neck: supple w/o JVD Cardiovascular: RRR w/o MRG Respiratory: CTA B Abdomen: soft, nt, nd, bs+ Skin: no rash nor lesion Musculoskeletal: MAE, full ROM all 4 extremities Psychiatric: normal tone and affect Neurologic: AAOx3, grossly non-focal  Labs on Admission:  Basic Metabolic Panel:  Recent Labs Lab 07/21/12 2217  NA 140  K 3.7  CL 102  CO2 28  GLUCOSE 72  BUN 13  CREATININE 0.65  CALCIUM 9.1   Liver Function Tests:  Recent Labs Lab 07/21/12 2217  AST 15  ALT 8  ALKPHOS 30*  BILITOT 0.5  PROT 6.8  ALBUMIN 3.7   No results found for this basename: LIPASE, AMYLASE,  in the last 168 hours No results found for this basename: AMMONIA,  in the last 168 hours CBC:  Recent Labs Lab 07/21/12 2217  WBC 6.7  NEUTROABS 4.4  HGB 12.6  HCT 36.8  MCV 87.0  PLT 144*   Cardiac Enzymes: No results found for this basename: CKTOTAL, CKMB, CKMBINDEX, TROPONINI,  in the last 168 hours  BNP (last 3 results) No results found for this basename: PROBNP,  in the last 8760 hours CBG: No results found for this basename: GLUCAP,  in the last 168 hours  Radiological Exams on Admission: No results found.  EKG: Independently reviewed.  Assessment/Plan Principal Problem:   Weakness   1.  Weakness - BLE weakness and blurred vision in both eyes, need to rule out neuromyelitis optica with MRI.  MRI C spine, T spine and after talking with Dr. Amada Jupiter MRI brain have all been ordered.  Admitting patient to obs so that these may be done in morning.  Dr. Amada Jupiter has seen patient in consultation.  Code Status: Full Code (must indicate code status--if unknown or must be presumed, indicate so) Family Communication: Spoke with husband at bedside (indicate person spoken with, if applicable, with phone number if by telephone) Disposition Plan: Admit to obs (indicate anticipated LOS)  Time spent: 30  min  GARDNER, JARED M. [facility] Pager [phone]  If 7PM-7AM, please contact night-coverage [email or web address] Password [facility] 07/21/2012, 11:42 PM

## 2012-07-21 NOTE — ED Notes (Signed)
Pt c/o blurry vision and weakness in legs since Friday, [facility] tonight for eval and they sent to [facility] for a possible mri. Now, A&Ox4, ambulatory with assistance, grips = bilateral, no slurred pseech or facial droop.

## 2012-07-21 NOTE — ED Notes (Addendum)
Patient reports that she had an exploratory laparoscopy done at [facility] Friday morning. Patient reports that she has been experiencing blurred vision and leg heaviness since Friday evening after she woke up.  Patient reports that she can ambulate, but needs assistance.  Patient denies pain, but states that her legs feel like an elephant is sitting on them (entire length of legs).  Smile symmetrical; no facial droop present.  Hand grips and foot pushes bilaterally equal and strong.  Denies headache.  Patient states that she is unable to move her legs while lying down in bed.  Legs fall to bed immediately after asking patient to hold them up.  Patient alert and oriented x4; PERRL present.

## 2012-07-21 NOTE — MAU Provider Note (Signed)
History     CSN: [id number]  Arrival date & time 07/21/12  1440   None     Chief Complaint  Patient presents with  . Blurred Vision    (Consider location/radiation/quality/duration/timing/severity/associated sxs/prior treatment) HPI Kathy Wu is a 37 y.o. G2P1 s/p dx lap on 2/7 for RLQ. She c/o blurred vision, no bright spots or loss of vision. She wears glasses, last eye exam 11/13, changed Rx. She also c/o her legs feeling heavy, very hard for her to move them without assistance. No tingling or numbness. Needs help to stand, can support her weight, ambulates slowly.  Had bloody drainage from  umbilical incision in shower this am. No real abd pain- sore/tender from surgery. + flatus, voiding without difficulty. Poor appetite, had crackers yesterday, muffin this am.  Past Medical History  Diagnosis Date  . No pertinent past medical history     Past Surgical History  Procedure Laterality Date  . Cholecystectomy      Family History  Problem Relation Age of Onset  . Hypertension Mother     History  Substance Use Topics  . Smoking status: Former Smoker -- 5 years    Types: Cigarettes    Quit date: 08/01/2010  . Smokeless tobacco: Not on file  . Alcohol Use: Yes     Comment: occasionally    OB History   Grav Para Term Preterm Abortions TAB SAB Ect Mult Living                  Review of Systems  Constitutional: Negative for fever and chills.  HENT: Negative.   Eyes: Positive for visual disturbance. Negative for photophobia, pain, discharge, redness and itching.  Gastrointestinal: Negative for nausea, vomiting, abdominal pain, diarrhea, constipation and abdominal distention.  Genitourinary: Negative for dysuria, urgency, frequency and vaginal pain.  Musculoskeletal: Positive for gait problem.  Skin: Negative.   Neurological: Positive for weakness. Negative for dizziness, tremors, seizures, syncope, speech difficulty, light-headedness and headaches.   Psychiatric/Behavioral: Negative.     Allergies  Review of patient's allergies indicates no known allergies.  Home Medications  No current outpatient prescriptions on file.  BP 117/74  Pulse 65  Temp(Src) 98.1 F (36.7 C) (Oral)  Resp 16  Ht 5' 2.5" (1.588 m)  Wt 112 lb (50.803 kg)  BMI 20.15 kg/m2  LMP 05/28/2012  Physical Exam  Constitutional: She is oriented to person, place, and time. She appears well-developed and well-nourished.  Eyes: Conjunctivae are normal. Pupils are equal, round, and reactive to light.  Neck: Normal range of motion. Neck supple.  Abdominal: Soft. Bowel sounds are normal. She exhibits no distension and no mass. There is no tenderness. There is no rebound and no guarding.  Musculoskeletal:  Difficulty moving her legs/toes, but can move them. + pulses  Neurological: She is alert and oriented to person, place, and time.  Skin: Skin is warm and dry.  Feet cold- nl for her  Psychiatric: She has a normal mood and affect. Her behavior is normal.    ED Course  Procedures (including critical care time)  Labs Reviewed  URINALYSIS, ROUTINE W REFLEX MICROSCOPIC - Abnormal; Notable for the following:    Bilirubin Urine SMALL (*)    Ketones, ur 40 (*)    All other components within normal limits   No results found.   No diagnosis found.    MDM  Consulted with Dr Vincente Poli at [facility]- she has had patients with these symptoms after dx lap, seem to be  related to anesthesia they received. To have anesthesia see pt and clear for discharge. Pt to see Dr Marcelle Overlie in the office this week.  5:45pm- Dr Rodman Pickle came and saw the pt. They are concerned, desire further evaluation. They would like CT of head with blurry vision and evaluation by neurology. I called and talked with Dr Vincente Poli, transfer pt to [facility] ED. She requests  ED MD call her with results.  Pt and her husband want to go to [facility], I called and talked with ED MD, they are expecting her.

## 2012-07-21 NOTE — ED Provider Notes (Signed)
History     CSN: [id number]  Arrival date & time 07/21/12  4098   First MD Initiated Contact with Patient 07/21/12 2119      Chief Complaint  Patient presents with  . Weakness    (Consider location/radiation/quality/duration/timing/severity/associated sxs/prior treatment) HPI  Patient presents to the emergency department here for a one-month hospital for evaluation for bilateral lower leg weakness and blurry vision. She had a procedure done on the hospital on Friday a exploratory laparotomy done to evaluate her ovaries. The incision sites at the umbilicus and suprapubic region. She was under sedation. Nothing was found a laparotomy and she had no complications during surgery. She says that ever since waking up from sedation she has had sensation to her lower legs severe weakness. She also says that her vision is blurry. She wears glasses normally but now feels it is worse than it normally is. She denies having any headache any neck pain or back pain. She denies having any urinary or bowel incontinence. She denies having history of the same happening in the past. nad vss  Past Medical History  Diagnosis Date  . No pertinent past medical history     Past Surgical History  Procedure Laterality Date  . Cholecystectomy      Family History  Problem Relation Age of Onset  . Hypertension Mother     History  Substance Use Topics  . Smoking status: Former Smoker -- 5 years    Types: Cigarettes    Quit date: 08/01/2010  . Smokeless tobacco: Not on file  . Alcohol Use: Yes     Comment: occasionally    OB History   Grav Para Term Preterm Abortions TAB SAB Ect Mult Living                  Review of Systems  Review of Systems  Gen: no weight loss, fevers, chills, night sweats  Eyes: no discharge or drainage, no occular pain , + blurry vision Nose: no epistaxis or rhinorrhea  Mouth: no dental pain, no sore throat  Neck: no neck pain  Lungs:No wheezing, coughing or  hemoptysis CV: no chest pain, palpitations, dependent edema or orthopnea  Abd: no abdominal pain, nausea, vomiting  GU: no dysuria or gross hematuria  MSK:  Lower extremity weakness bilaterally Neuro: no headache, no focal neurologic deficits  Skin: no abnormalities Psyche: negative.   Allergies  Review of patient's allergies indicates no known allergies.  Home Medications   Current Outpatient Rx  Name  Route  Sig  Dispense  Refill  . oxyCODONE-acetaminophen (ROXICET) 5-325 MG per tablet   Oral   Take 1 tablet by mouth every 4 (four) hours as needed for pain.   30 tablet   0     BP 109/69  Pulse 74  Temp(Src) 98.6 F (37 C) (Oral)  Resp 16  SpO2 98%  LMP 05/28/2012  Physical Exam  Nursing note and vitals reviewed. Constitutional: She is oriented to person, place, and time. She appears well-developed and well-nourished. No distress.  HENT:  Head: Normocephalic and atraumatic.  Eyes: Pupils are equal, round, and reactive to light.  Neck: Normal range of motion. Neck supple.  Cardiovascular: Normal rate and regular rhythm.   Pulmonary/Chest: Effort normal.  Abdominal: Soft.  Musculoskeletal:       Cervical back: Normal.       Thoracic back: Normal.       Lumbar back: Normal.  Neurological: She is alert and oriented to person, place,  and time. No cranial nerve deficit or sensory deficit. She exhibits abnormal muscle tone.  Reflex Scores:      Patellar reflexes are 2+ on the right side and 2+ on the left side.      Achilles reflexes are 2+ on the right side and 2+ on the left side. Unable to ambulate due to weakness in legs  Skin: Skin is warm and dry.    ED Course  Procedures (including critical care time)  Labs Reviewed  CBC WITH DIFFERENTIAL - Abnormal; Notable for the following:    Platelets 144 (*)    All other components within normal limits  COMPREHENSIVE METABOLIC PANEL - Abnormal; Notable for the following:    Alkaline Phosphatase 30 (*)    All  other components within normal limits   No results found.   1. Lower extremity weakness       MDM  I consulted Dr. Petra Kuba for advice on work-up for patient. He agreed to see patient.  Dr. Anna Genre has seen patient and feels that she needs MRI of cervical and thoracic spine. He feels that its a possibility that it is psychogenic but we need MRI. MRI unavailable at this time and he does not feel that they need to be called in emergently. He recommends admission to medicine to have MRIs done in the morning.  Admit to [facility], Dr. Lyda Perone, inpatient,  Med-surg       Dorthula Matas, [place] 07/21/12 2311

## 2012-07-21 NOTE — Progress Notes (Signed)
Asked by Eveline Keto, NP on behalf of Dr Vincente Poli to evaluate patient for possibility that her symptoms are side effect of anesthesia.  Mrs Garofano had GETA for diagnostic laparoscopy on 07/19/12.  Her total anesthesia time was less than 30 minutes and was uneventful. She was discharged home from the PACU in stable condition.  She reports that she was sore and sedated that first day.  She reports that since surgery she has had blurry vision and has had difficulty moving her legs.  She had her husband removed her scopolamine patch the second day so that she would not touch her eyes, in case it was causing the blurry vision.  Now on the third day, her symptoms are not improved.  She continues to have blurry vision.  She normally wears glasses, but reports this is unlike her uncorrected vision.  She is unable to focus on objects close up.  As for her legs, she is unable to move them without manually lifting them with her arms.  She can walk assisted by her husband.  She is able to wiggle her toes slightly.  She denies headache, nausea, vomiting, photo or phonophobia, backache, or decreased sensation in her legs.  She has normal bowel and bladder control, although she has not had a bowel movement since surgery.  She is able to move normally above the waist, although she is sore from her surgical incisions.  She has only taken three of the narcotic pain pills she was given after surgery.  On exam, VSS, AF. Motor strength in BUE is 5/5 Motor strength in BLE is 1/5 in all ranges, she is able to wiggle toes slightly Sensation is grossly intact throughout BLE Gait not assessed   She and her husband are very concerned about the neurologic symptoms of blurry vision and leg weakness.  Husband is demanding to see a "real doctor" and requesting a "CAT Scan" to evaluate blurry vision.  Given her short duration of uneventful anesthesia over 48 hours ago, I do not feel that these symptoms are related to her anesthetic.  Further  evaluation is warranted.  Discussed with Eveline Keto, NP who will discuss with Dr Lorenz Coaster, MD

## 2012-07-21 NOTE — Consult Note (Signed)
Reason for Consult: LE weakness Referring Physician: Gwyneth Sprout  CC: LE weakness  History is obtained from: Patient, husband.   HPI: Janayla Marik is a 37 y.o. female who underwent an exploratory laparotomy on 02/07 for ovarian cysts and since that time has had LE weakness and blurred vision. She reportedly was walking somewhat unsteadily when she came home form the hospital, but then took a nap, and when she awaok, her vision was blurred and she felt that her legs were heavy. She states that since that time, she has had no worsening, but no improvement either.    ROS: A 14 point ROS was performed and is negative except as noted in the HPI.  Past Medical History  Diagnosis Date  . No pertinent past medical history     Family History: Mother- cervical cancer  Social History: Tob: denies Lives with husband and daughter  Exam: Current vital signs: BP 109/69  Pulse 74  Temp(Src) 98.6 F (37 C) (Oral)  Resp 16  SpO2 98%  LMP 05/28/2012 Vital signs in last 24 hours: Temp:  [98.1 F (36.7 C)-98.6 F (37 C)] 98.6 F (37 C) (02/09 2130) Pulse Rate:  [65-81] 74 (02/09 2215) Resp:  [16-18] 16 (02/09 2215) BP: (109-117)/(67-76) 109/69 mmHg (02/09 2215) SpO2:  [98 %-100 %] 98 % (02/09 2215) Weight:  [50.803 kg (112 lb)] 50.803 kg (112 lb) (02/09 1500)  General: in bed, NAD CV: RRR Mental Status: Patient is awake, alert, oriented to person, place, month, year, and situation. Immediate and remote memory are intact. Patient is able to give a clear and coherent history. Able to spell world backwards without difficulty No signs of aphasia.  Cranial Nerves: II: Visual Fields are full. Pupils are equal, round, and reactive to light.  Discs are sharp. III,IV, VI: EOMI without ptosis or diploplia.  V: Facial sensation is symmetric to temperature VII: Facial movement is symmetric.  VIII: hearing is intact to voice X: Uvula elevates symmetrically XI: Shoulder shrug is  symmetric. XII: tongue is midline without atrophy or fasciculations.  Motor: Tone is normal. Bulk is normal. 5/5 strength was present in upper extremities. In the lower extremities, there is an inconsistent exam, with 3/5 strength given to command in all groups, but when repositioning herself, she appears to have more strength.  Sensory: Sensation is symmetric to light touch and temperature in the arms and legs. With both temperature and pin, no spinal level can be elicited.  Deep Tendon Reflexes: 2+ and symmetric in the biceps, BR, and ankles. Though her knees may be slightly brisk, this could be normal for a female her age and there is no spread.  Plantars: Toes are downgoing bilaterally.  Cerebellar: FNF intact bilaterally Gait: Patient uses arms to lift herself into standing position, but then is able to take a couple of straight legged steps with minimal assistance.   I have reviewed labs in epic and the results pertinent to this consultation are: CMP, CBC unremarkable.   Impression: 37 yo F with acute onset LE weakness and blurred vision on Friday. 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 Possible etiologies would include transverse myelitis(neuromyelitis optica can cause bilateral optic neuritis and TM, thoguh typically at sepearte time points), cord  And posterior circulation infarct(though I feel this is unlikely). Compressive spinal lesions would not eb expected to cause the blurred vision and she has no objective signs of this on exam.   With normal to  slightly brisk reflexes, I do not feel that GBS would be likely.   Recommendations: 1) MRI T-Spine and C-Spine w/wo contrast to investigate weakness.  2) MRI brain for blurred vision. 3) If these tests are normal, then I feel that it would be safe to discharge her home with outpatient follow up.    Ritta Slot, MD [facility] [phone]  If 7pm-  7am, please page neurology on call at [phone].

## 2012-07-21 NOTE — MAU Note (Signed)
Legs equal in size, color and warmth. Unable to lift legs from bed unless she uses her hands to lift them.

## 2012-07-21 NOTE — MAU Note (Signed)
Pt reports she had exploritory lap to visualize her overies. Since discharge she has had blurred vision and her legs feel very heavy. Taking oxycodone for incisional pain.

## 2012-07-22 ENCOUNTER — Observation Stay (HOSPITAL_COMMUNITY): Payer: BC Managed Care – PPO

## 2012-07-22 ENCOUNTER — Encounter (HOSPITAL_COMMUNITY): Payer: Self-pay | Admitting: *Deleted

## 2012-07-22 DIAGNOSIS — R1031 Right lower quadrant pain: Secondary | ICD-10-CM | POA: Diagnosis present

## 2012-07-22 LAB — ANTITHROMBIN III: AntiThromb III Func: 97 % (ref 75–120)

## 2012-07-22 MED ORDER — HEPARIN (PORCINE) IN NACL 100-0.45 UNIT/ML-% IJ SOLN
800.0000 [IU]/h | INTRAMUSCULAR | Status: DC
  Administered 2012-07-22 – 2012-07-24 (×2): 800 [IU]/h via INTRAVENOUS
  Filled 2012-07-22 (×3): qty 250

## 2012-07-22 MED ORDER — IOHEXOL 300 MG/ML  SOLN
100.0000 mL | Freq: Once | INTRAMUSCULAR | Status: AC | PRN
  Administered 2012-07-22: 100 mL via INTRAVENOUS

## 2012-07-22 MED ORDER — IOHEXOL 300 MG/ML  SOLN
25.0000 mL | INTRAMUSCULAR | Status: AC
  Administered 2012-07-22 (×2): 25 mL via ORAL

## 2012-07-22 NOTE — Progress Notes (Addendum)
[facility] PROGRESS NOTE  Kathy Wu ZOX:[id number] DOB: Sep 27, 1975 DOA: 07/21/2012 PCP: Provider Not In System  Assessment/Plan: Principal Problem:   Lower extremity weakness MRI of the brain, cervical, thoracic spine did not reveal any organic etiology for her weakness. Her exam is not consistent with a neurological problem. I have requested a psychiatric consult as recommended by neurology. I have also ordered a PT/OT eval to see if any home health needs are present Active Problems:   RLQ abdominal pain Will obtain a CT abdomen pelvis with contrast.     Code Status: Local Family Communication: Husband Disposition Plan: Monitor overnight, likely DC in a.m.   Consultants:  Urology  Procedures:  None  Antibiotics:  none  HPI/Subjective: Kathy Wu is a 37 y.o. female who presents with c/o BLE weakness and blurry vision. Symptoms onset after an ex-lap when waking up from sedation on Friday. She describes severe BLE weakness located in her lower legs, and vision more blurry than baseline.  In the ED neurology was consulted and saw the patient, they were mostly unimpressed but did want to get an MRI of her C and T spine to rule out neuromyelitis optica but they did not feel that this needed to be done emergently.   Pt is irritable and  frustrated about not knowing what is going on. Her symptoms persist as before. She becomes even more irritable when discussing her pelvic pain. She states that it is exacerbated with intercourse which is why she believes that it is GYN related. She's had cysts on her ovary in the past which have ruptured- I've explained that the exploratory laparoscopy did not reveal any. I have personally spoken with Dr. Marcelle Overlie this morning and he states that the patient did call on Friday after the procedure frustrated about why a solution had not been found for her pain. He has never seen leg weakness like this in relation to a laparoscopy. Her procedure  was about 30 minutes without any complications.   When I suggest to the patient that it may be GI related, she is hesitant to believe me. I've also suggested that it may just be a musculoskeletal sprain. However, I will get a CT scan today. She is agreeable to this.  I have explained that she will likely not go home unless I know she is able to safely ambulate to the bathroom- she once again becomes irritable. I recommended a PT/OT eval. Her husband is in the room and quite agreeable.  Upon speaking with Onalee Hua, neurology PA, he tells me that he suspects this is psychological and recommends a psychiatric consult which had ordered.  Objective: Filed Vitals:   07/22/12 0123 07/22/12 0500 07/22/12 0955 07/22/12 1351  BP:  97/66 115/76 111/72  Pulse:  63 80 63  Temp:  97.8 F (36.6 C) 98.6 F (37 C) 98.3 F (36.8 C)  TempSrc:   Oral Oral  Resp:  18 16 16   Height: 5\' 2"  (1.575 m)     Weight: 50.803 kg (112 lb)     SpO2:  100% 99% 100%    Intake/Output Summary (Last 24 hours) at 07/22/12 1453 Last data filed at 07/22/12 1351  Gross per 24 hour  Intake    240 ml  Output    200 ml  Net     40 ml   Filed Weights   07/22/12 0123  Weight: 50.803 kg (112 lb)    Exam:   General:  Awake alert oriented x3, irritable mood  Cardiovascular: Regular rate and rhythm no murmurs   Respiratory: To auscultation bilaterally   Abdomen: Soft, tender in right lower quadrant, no rebound tenderness, no mass, bowel sounds positive  Neuro: See neurology note for thorough exam-lower extremity strength is 1/5 on my exam  Data Reviewed: Basic Metabolic Panel:  Recent Labs Lab 07/21/12 2217  NA 140  K 3.7  CL 102  CO2 28  GLUCOSE 72  BUN 13  CREATININE 0.65  CALCIUM 9.1   Liver Function Tests:  Recent Labs Lab 07/21/12 2217  AST 15  ALT 8  ALKPHOS 30*  BILITOT 0.5  PROT 6.8  ALBUMIN 3.7   No results found for this basename: LIPASE, AMYLASE,  in the last 168 hours No results  found for this basename: AMMONIA,  in the last 168 hours CBC:  Recent Labs Lab 07/21/12 2217  WBC 6.7  NEUTROABS 4.4  HGB 12.6  HCT 36.8  MCV 87.0  PLT 144*   Cardiac Enzymes: No results found for this basename: CKTOTAL, CKMB, CKMBINDEX, TROPONINI,  in the last 168 hours BNP (last 3 results) No results found for this basename: PROBNP,  in the last 8760 hours CBG: No results found for this basename: GLUCAP,  in the last 168 hours  No results found for this or any previous visit (from the past 240 hour(s)).   Studies: Mr Brain 43 Contrast  07/22/2012  *RADIOLOGY REPORT*  Clinical Data:  37 year old female with unexplained sudden onset bilateral lower extremity weakness and blurred vision following exploratory laparotomy on 07/19/2012 for ovarian cysts.  Comparison:   None.  MRI HEAD WITHOUT CONTRAST  Technique:  Multiplanar, multiecho pulse sequences of the brain and surrounding structures were obtained without intravenous contrast.  Findings:  Normal cerebral volume. No restricted diffusion to suggest acute infarction.  No midline shift, mass effect, evidence of mass lesion, ventriculomegaly, extra-axial collection or acute intracranial hemorrhage.  Cervicomedullary junction and pituitary are within normal limits.  Major intracranial vascular flow voids are preserved. Wallace Cullens and white matter signal is within normal limits throughout the brain.  Cervical spine findings are described below.  Visualized orbit soft tissues are within normal limits.  Visualized paranasal sinuses and mastoids are clear.  Negative scalp soft tissues. Visualized bone marrow signal is within normal limits.  IMPRESSION:   1. Normal noncontrast MRI appearance of the brain. 2.  Cervical and thoracic spine findings are below.  MRI CERVICAL SPINE WITHOUT CONTRAST  Technique:  Multiplanar and multiecho pulse sequences of the cervic al spine, to include the craniocervical junction and cervicothoraci c junction, were obtained  according to standard protocol without intravenous contrast.  Findings:  Cervicomedullary junction is within normal limits. Normal cervical spinal cord signal and morphology.  Normal cervical vertebral height and alignment. No marrow edema or evidence of acute osseous abnormality.  Visualized paraspinal soft tissues are within normal limits.  No significant cervical spine degenerative changes.  No spinal stenosis or neural impingement.  IMPRESSION: 1.  Normal noncontrast MRI appearance of the cervical spine. 2.  Thoracic spine findings are below.  MRI THORACIC SPINE WITHOUT CONTRAST  Technique: Multiplanar and multiecho pulse sequences of the thoracic spine were obtained without intravenous contrast.  Findings:  Normal thoracic vertebral height and alignment. No marrow edema or evidence of acute osseous abnormality.  Normal bone marrow signal. No marrow edema or evidence of acute osseous abnormality.  Visualized paraspinal soft tissues are within normal limits.  Trace layering pleural effusions.  Otherwise negative visualized thoracic viscera negative  visualized upper abdominal viscera.  Normal thoracic spinal cord signal and morphology.  The conus medullaris occurs below the L1 level and is only partially visualized.  Normal thoracic spinal canal patency.  No thoracic spine disc degeneration.  No spinal or foraminal stenosis.  IMPRESSION: 1.  Normal noncontrast MRI appearance of the thoracic spine. 2.  Trace layering pleural effusions.   Original Report Authenticated By: Erskine Speed, M.D.    Mr Cervical Spine Wo Contrast  07/22/2012  *RADIOLOGY REPORT*  Clinical Data:  37 year old female with unexplained sudden onset bilateral lower extremity weakness and blurred vision following exploratory laparotomy on 07/19/2012 for ovarian cysts.  Comparison:   None.  MRI HEAD WITHOUT CONTRAST  Technique:  Multiplanar, multiecho pulse sequences of the brain and surrounding structures were obtained without intravenous  contrast.  Findings:  Normal cerebral volume. No restricted diffusion to suggest acute infarction.  No midline shift, mass effect, evidence of mass lesion, ventriculomegaly, extra-axial collection or acute intracranial hemorrhage.  Cervicomedullary junction and pituitary are within normal limits.  Major intracranial vascular flow voids are preserved. Wallace Cullens and white matter signal is within normal limits throughout the brain.  Cervical spine findings are described below.  Visualized orbit soft tissues are within normal limits.  Visualized paranasal sinuses and mastoids are clear.  Negative scalp soft tissues. Visualized bone marrow signal is within normal limits.  IMPRESSION:   1. Normal noncontrast MRI appearance of the brain. 2.  Cervical and thoracic spine findings are below.  MRI CERVICAL SPINE WITHOUT CONTRAST  Technique:  Multiplanar and multiecho pulse sequences of the cervic al spine, to include the craniocervical junction and cervicothoraci c junction, were obtained according to standard protocol without intravenous contrast.  Findings:  Cervicomedullary junction is within normal limits. Normal cervical spinal cord signal and morphology.  Normal cervical vertebral height and alignment. No marrow edema or evidence of acute osseous abnormality.  Visualized paraspinal soft tissues are within normal limits.  No significant cervical spine degenerative changes.  No spinal stenosis or neural impingement.  IMPRESSION: 1.  Normal noncontrast MRI appearance of the cervical spine. 2.  Thoracic spine findings are below.  MRI THORACIC SPINE WITHOUT CONTRAST  Technique: Multiplanar and multiecho pulse sequences of the thoracic spine were obtained without intravenous contrast.  Findings:  Normal thoracic vertebral height and alignment. No marrow edema or evidence of acute osseous abnormality.  Normal bone marrow signal. No marrow edema or evidence of acute osseous abnormality.  Visualized paraspinal soft tissues are within  normal limits.  Trace layering pleural effusions.  Otherwise negative visualized thoracic viscera negative visualized upper abdominal viscera.  Normal thoracic spinal cord signal and morphology.  The conus medullaris occurs below the L1 level and is only partially visualized.  Normal thoracic spinal canal patency.  No thoracic spine disc degeneration.  No spinal or foraminal stenosis.  IMPRESSION: 1.  Normal noncontrast MRI appearance of the thoracic spine. 2.  Trace layering pleural effusions.   Original Report Authenticated By: Erskine Speed, M.D.    Mr Thoracic Spine Wo Contrast  07/22/2012  *RADIOLOGY REPORT*  Clinical Data:  37 year old female with unexplained sudden onset bilateral lower extremity weakness and blurred vision following exploratory laparotomy on 07/19/2012 for ovarian cysts.  Comparison:   None.  MRI HEAD WITHOUT CONTRAST  Technique:  Multiplanar, multiecho pulse sequences of the brain and surrounding structures were obtained without intravenous contrast.  Findings:  Normal cerebral volume. No restricted diffusion to suggest acute infarction.  No midline shift, mass effect,  evidence of mass lesion, ventriculomegaly, extra-axial collection or acute intracranial hemorrhage.  Cervicomedullary junction and pituitary are within normal limits.  Major intracranial vascular flow voids are preserved. Wallace Cullens and white matter signal is within normal limits throughout the brain.  Cervical spine findings are described below.  Visualized orbit soft tissues are within normal limits.  Visualized paranasal sinuses and mastoids are clear.  Negative scalp soft tissues. Visualized bone marrow signal is within normal limits.  IMPRESSION:   1. Normal noncontrast MRI appearance of the brain. 2.  Cervical and thoracic spine findings are below.  MRI CERVICAL SPINE WITHOUT CONTRAST  Technique:  Multiplanar and multiecho pulse sequences of the cervic al spine, to include the craniocervical junction and cervicothoraci c  junction, were obtained according to standard protocol without intravenous contrast.  Findings:  Cervicomedullary junction is within normal limits. Normal cervical spinal cord signal and morphology.  Normal cervical vertebral height and alignment. No marrow edema or evidence of acute osseous abnormality.  Visualized paraspinal soft tissues are within normal limits.  No significant cervical spine degenerative changes.  No spinal stenosis or neural impingement.  IMPRESSION: 1.  Normal noncontrast MRI appearance of the cervical spine. 2.  Thoracic spine findings are below.  MRI THORACIC SPINE WITHOUT CONTRAST  Technique: Multiplanar and multiecho pulse sequences of the thoracic spine were obtained without intravenous contrast.  Findings:  Normal thoracic vertebral height and alignment. No marrow edema or evidence of acute osseous abnormality.  Normal bone marrow signal. No marrow edema or evidence of acute osseous abnormality.  Visualized paraspinal soft tissues are within normal limits.  Trace layering pleural effusions.  Otherwise negative visualized thoracic viscera negative visualized upper abdominal viscera.  Normal thoracic spinal cord signal and morphology.  The conus medullaris occurs below the L1 level and is only partially visualized.  Normal thoracic spinal canal patency.  No thoracic spine disc degeneration.  No spinal or foraminal stenosis.  IMPRESSION: 1.  Normal noncontrast MRI appearance of the thoracic spine. 2.  Trace layering pleural effusions.   Original Report Authenticated By: Erskine Speed, M.D.     Scheduled Meds:  Continuous Infusions:   Principal Problem:   Weakness    Time spent: 45 minutes    [facility]  [facility] Pager [phone]  If 8PM-8AM, please contact night-coverage at [email or web address], password [facility] 07/22/2012, 2:53 PM  LOS: 1 day

## 2012-07-22 NOTE — ED Notes (Signed)
Patient currently resting quietly in bed; no respiratory or acute distress noted.  Patient updated on plan of care; informed patient that bed request has been put in and that we are currently waiting on a bed assignment.  Patient denies any needs at this time; redressed suture site around navel.  Will continue to monitor.

## 2012-07-22 NOTE — Progress Notes (Signed)
NEURO HOSPITALIST PROGRESS NOTE   SUBJECTIVE:                                                                                                                        Continues to feel bilateral LE are week.  No difficulty with urination or defecation per patient. Able to feel bilateral LE.   OBJECTIVE:                                                                                                                           Vital signs in last 24 hours: Temp:  [97.8 F (36.6 C)-98.6 F (37 C)] 98.6 F (37 C) (02/10 0955) Pulse Rate:  [63-81] 80 (02/10 0955) Resp:  [16-18] 16 (02/10 0955) BP: (97-117)/(65-76) 115/76 mmHg (02/10 0955) SpO2:  [96 %-100 %] 99 % (02/10 0955) Weight:  [50.803 kg (112 lb)] 50.803 kg (112 lb) (02/10 0123)  Intake/Output from previous day: 02/09 0701 - 02/10 0700 In: 240 [P.O.:240] Out: -  Intake/Output this shift: Total I/O In: -  Out: 200 [Urine:200] Nutritional status: General  Past Medical History  Diagnosis Date  . No pertinent past medical history       Neurologic Exam:   Mental Status: Alert, oriented, thought content appropriate.  Speech fluent without evidence of aphasia.  Able to follow 3 step commands without difficulty. Cranial Nerves: II: Discs flat bilaterally; Visual fields grossly normal, pupils equal, round, reactive to light and accommodation III,IV, VI: ptosis not present, extra-ocular motions intact bilaterally V,VII: smile symmetric, facial light touch sensation normal bilaterally VIII: hearing normal bilaterally IX,X: gag reflex present XI: bilateral shoulder shrug XII: midline tongue extension Motor: Right : Upper extremity   5/5    Left:     Upper extremity   5/5  Lower extremity   2/5     Lower extremity   2/5 --of note, patient has a positive hoovers sign, shows 5/5 strength with hip adduction, 4/5 strength with hip abduction when she is distracted, able to hold knee in extension  with 4/5 strength and when asked to bend her knee she will hold knee in extension antigravity. When asked to move in the bed, patient states she cannot move her legs.  Tone and bulk:normal  tone throughout; no atrophy noted Sensory: Pinprick and light touch intact throughout, bilaterally, with no dermatomal pattern. Deep Tendon Reflexes: 2+ and symmetric throughout Plantars: Right: downgoing   Left: downgoing Cerebellar: normal finger-to-nose,  CV: pulses palpable throughout   Lab Results: No results found for this basename: cbc, bmp, coags, chol, tri, ldl, hga1c   Lipid Panel No results found for this basename: CHOL, TRIG, HDL, CHOLHDL, VLDL, LDLCALC,  in the last 72 hours  Studies/Results: Mr Brain Wo Contrast  07/22/2012  *RADIOLOGY REPORT*  Clinical Data:  37 year old female with unexplained sudden onset bilateral lower extremity weakness and blurred vision following exploratory laparotomy on 07/19/2012 for ovarian cysts.  Comparison:   None.  MRI HEAD WITHOUT CONTRAST  Technique:  Multiplanar, multiecho pulse sequences of the brain and surrounding structures were obtained without intravenous contrast.  Findings:  Normal cerebral volume. No restricted diffusion to suggest acute infarction.  No midline shift, mass effect, evidence of mass lesion, ventriculomegaly, extra-axial collection or acute intracranial hemorrhage.  Cervicomedullary junction and pituitary are within normal limits.  Major intracranial vascular flow voids are preserved. Wallace Cullens and white matter signal is within normal limits throughout the brain.  Cervical spine findings are described below.  Visualized orbit soft tissues are within normal limits.  Visualized paranasal sinuses and mastoids are clear.  Negative scalp soft tissues. Visualized bone marrow signal is within normal limits.  IMPRESSION:   1. Normal noncontrast MRI appearance of the brain. 2.  Cervical and thoracic spine findings are below.  MRI CERVICAL SPINE WITHOUT  CONTRAST  Technique:  Multiplanar and multiecho pulse sequences of the cervic al spine, to include the craniocervical junction and cervicothoraci c junction, were obtained according to standard protocol without intravenous contrast.  Findings:  Cervicomedullary junction is within normal limits. Normal cervical spinal cord signal and morphology.  Normal cervical vertebral height and alignment. No marrow edema or evidence of acute osseous abnormality.  Visualized paraspinal soft tissues are within normal limits.  No significant cervical spine degenerative changes.  No spinal stenosis or neural impingement.  IMPRESSION: 1.  Normal noncontrast MRI appearance of the cervical spine. 2.  Thoracic spine findings are below.  MRI THORACIC SPINE WITHOUT CONTRAST  Technique: Multiplanar and multiecho pulse sequences of the thoracic spine were obtained without intravenous contrast.  Findings:  Normal thoracic vertebral height and alignment. No marrow edema or evidence of acute osseous abnormality.  Normal bone marrow signal. No marrow edema or evidence of acute osseous abnormality.  Visualized paraspinal soft tissues are within normal limits.  Trace layering pleural effusions.  Otherwise negative visualized thoracic viscera negative visualized upper abdominal viscera.  Normal thoracic spinal cord signal and morphology.  The conus medullaris occurs below the L1 level and is only partially visualized.  Normal thoracic spinal canal patency.  No thoracic spine disc degeneration.  No spinal or foraminal stenosis.  IMPRESSION: 1.  Normal noncontrast MRI appearance of the thoracic spine. 2.  Trace layering pleural effusions.   Original Report Authenticated By: Erskine Speed, M.D.    Mr Cervical Spine Wo Contrast  07/22/2012  *RADIOLOGY REPORT*  Clinical Data:  37 year old female with unexplained sudden onset bilateral lower extremity weakness and blurred vision following exploratory laparotomy on 07/19/2012 for ovarian cysts.   Comparison:   None.  MRI HEAD WITHOUT CONTRAST  Technique:  Multiplanar, multiecho pulse sequences of the brain and surrounding structures were obtained without intravenous contrast.  Findings:  Normal cerebral volume. No restricted diffusion to suggest acute infarction.  No  midline shift, mass effect, evidence of mass lesion, ventriculomegaly, extra-axial collection or acute intracranial hemorrhage.  Cervicomedullary junction and pituitary are within normal limits.  Major intracranial vascular flow voids are preserved. Wallace Cullens and white matter signal is within normal limits throughout the brain.  Cervical spine findings are described below.  Visualized orbit soft tissues are within normal limits.  Visualized paranasal sinuses and mastoids are clear.  Negative scalp soft tissues. Visualized bone marrow signal is within normal limits.  IMPRESSION:   1. Normal noncontrast MRI appearance of the brain. 2.  Cervical and thoracic spine findings are below.  MRI CERVICAL SPINE WITHOUT CONTRAST  Technique:  Multiplanar and multiecho pulse sequences of the cervic al spine, to include the craniocervical junction and cervicothoraci c junction, were obtained according to standard protocol without intravenous contrast.  Findings:  Cervicomedullary junction is within normal limits. Normal cervical spinal cord signal and morphology.  Normal cervical vertebral height and alignment. No marrow edema or evidence of acute osseous abnormality.  Visualized paraspinal soft tissues are within normal limits.  No significant cervical spine degenerative changes.  No spinal stenosis or neural impingement.  IMPRESSION: 1.  Normal noncontrast MRI appearance of the cervical spine. 2.  Thoracic spine findings are below.  MRI THORACIC SPINE WITHOUT CONTRAST  Technique: Multiplanar and multiecho pulse sequences of the thoracic spine were obtained without intravenous contrast.  Findings:  Normal thoracic vertebral height and alignment. No marrow edema or  evidence of acute osseous abnormality.  Normal bone marrow signal. No marrow edema or evidence of acute osseous abnormality.  Visualized paraspinal soft tissues are within normal limits.  Trace layering pleural effusions.  Otherwise negative visualized thoracic viscera negative visualized upper abdominal viscera.  Normal thoracic spinal cord signal and morphology.  The conus medullaris occurs below the L1 level and is only partially visualized.  Normal thoracic spinal canal patency.  No thoracic spine disc degeneration.  No spinal or foraminal stenosis.  IMPRESSION: 1.  Normal noncontrast MRI appearance of the thoracic spine. 2.  Trace layering pleural effusions.   Original Report Authenticated By: Erskine Speed, M.D.    Mr Thoracic Spine Wo Contrast  07/22/2012  *RADIOLOGY REPORT*  Clinical Data:  37 year old female with unexplained sudden onset bilateral lower extremity weakness and blurred vision following exploratory laparotomy on 07/19/2012 for ovarian cysts.  Comparison:   None.  MRI HEAD WITHOUT CONTRAST  Technique:  Multiplanar, multiecho pulse sequences of the brain and surrounding structures were obtained without intravenous contrast.  Findings:  Normal cerebral volume. No restricted diffusion to suggest acute infarction.  No midline shift, mass effect, evidence of mass lesion, ventriculomegaly, extra-axial collection or acute intracranial hemorrhage.  Cervicomedullary junction and pituitary are within normal limits.  Major intracranial vascular flow voids are preserved. Wallace Cullens and white matter signal is within normal limits throughout the brain.  Cervical spine findings are described below.  Visualized orbit soft tissues are within normal limits.  Visualized paranasal sinuses and mastoids are clear.  Negative scalp soft tissues. Visualized bone marrow signal is within normal limits.  IMPRESSION:   1. Normal noncontrast MRI appearance of the brain. 2.  Cervical and thoracic spine findings are below.  MRI  CERVICAL SPINE WITHOUT CONTRAST  Technique:  Multiplanar and multiecho pulse sequences of the cervic al spine, to include the craniocervical junction and cervicothoraci c junction, were obtained according to standard protocol without intravenous contrast.  Findings:  Cervicomedullary junction is within normal limits. Normal cervical spinal cord signal and morphology.  Normal  cervical vertebral height and alignment. No marrow edema or evidence of acute osseous abnormality.  Visualized paraspinal soft tissues are within normal limits.  No significant cervical spine degenerative changes.  No spinal stenosis or neural impingement.  IMPRESSION: 1.  Normal noncontrast MRI appearance of the cervical spine. 2.  Thoracic spine findings are below.  MRI THORACIC SPINE WITHOUT CONTRAST  Technique: Multiplanar and multiecho pulse sequences of the thoracic spine were obtained without intravenous contrast.  Findings:  Normal thoracic vertebral height and alignment. No marrow edema or evidence of acute osseous abnormality.  Normal bone marrow signal. No marrow edema or evidence of acute osseous abnormality.  Visualized paraspinal soft tissues are within normal limits.  Trace layering pleural effusions.  Otherwise negative visualized thoracic viscera negative visualized upper abdominal viscera.  Normal thoracic spinal cord signal and morphology.  The conus medullaris occurs below the L1 level and is only partially visualized.  Normal thoracic spinal canal patency.  No thoracic spine disc degeneration.  No spinal or foraminal stenosis.  IMPRESSION: 1.  Normal noncontrast MRI appearance of the thoracic spine. 2.  Trace layering pleural effusions.   Original Report Authenticated By: Erskine Speed, M.D.     MEDICATIONS                                                                                                                       Scheduled medications reviewed.  ASSESSMENT/PLAN:                                                                                                              37 YO female with subjective bilateral LE weakness. Exam is inconsistent and shows 4/5 strength throughout, positive Hoover's sign.  MRI brain, C-spine and T-sine normal. Vision has resolved.   Recommend: 1) PT for evaluation 2) Psych consult   No further neurodiagnostic recommendations. Neurology will S/O   Assessment and plan discussed with with attending physician and they are in agreement.    Felicie Morn PA-C [facility] [phone]  07/22/2012, 2:07 PM

## 2012-07-22 NOTE — Evaluation (Signed)
Physical Therapy Evaluation Patient Details Name: Kathy Wu MRN: [id number] DOB: May 15, 1976 Today's Date: 07/22/2012 Time: 4098-1191 PT Time Calculation (min): 41 min  PT Assessment / Plan / Recommendation Clinical Impression  Pt is a 37 yo female who arrived to ED with c/o bilat LE wkns who just had an ex-lap for ovarian cysts. Pt with inconsistent demonstration of mobility capability. Pt repeated stated "I'll do whatever, I really want to go home."Pt demo'd significant bilat LE improvement in strength at end of PT session than at beginning. Pt demo'd safe ambulation with RW and safe stair negotiation with assist of 1 person. Spouse can provide 24/7 assist upon d/c. Pt safe to d/c home with RW and assist of spouse. Instructed pt and spouse that if patient still has these symptoms at her follow up next week with her surgeon to ask for outpatient PT script.    PT Assessment  Patient needs continued PT services    Follow Up Recommendations  Supervision/Assistance - 24 hour    Does the patient have the potential to tolerate intense rehabilitation      Barriers to Discharge None      Equipment Recommendations  Rolling walker with 5" wheels    Recommendations for Other Services     Frequency Min 3X/week    Precautions / Restrictions Precautions Precautions: Fall Restrictions Weight Bearing Restrictions: No   Pertinent Vitals/Pain Pt reports UE soreness from relying on them all weekend.      Mobility  Bed Mobility Bed Mobility: Supine to Sit Supine to Sit: 6: Modified independent (Device/Increase time);HOB flat Details for Bed Mobility Assistance: pt used UEs to assist LEs off bed. pt with noted quad set and maintained bilat knees lock in extension off the edge of the bed Transfers Transfers: Sit to Stand;Stand to Sit Sit to Stand: 4: Min guard;With upper extremity assist (up to RW) Stand to Sit: 5: Supervision;With upper extremity assist;To chair/3-in-1 Details for Transfer  Assistance: v/c's for safe hand placement Ambulation/Gait Ambulation/Gait Assistance: 4: Min guard Ambulation Distance (Feet): 50 Feet Assistive device: Rolling walker Ambulation/Gait Assistance Details: verbal cues to decrease bilat UE WBing, pt with extremely slow/cautious gait pattern but able to clear foot. Pt extremely slow and exagerated with advancement of bilat LEs. When asked to increase knee flexion pt able to. No episodes of LOB. Gait Pattern: Step-through pattern;Decreased stride length (increased UE WBing) Gait velocity: extremely slow Stairs: Yes Stairs Assistance: 4: Min guard;3: Mod assist Stairs Assistance Details (indicate cue type and reason): minA for ascending stairs, modA via HHA to descend the stairs. Pt extremely slow and exaggerated ascending of LE up to step. Pt with extremely single limb stance on contralateral limb when advancing opposite without knee buckling. Pt dragged toes up front of step and then was able to complete sufficient hip flexion to clear foot onto step. Despite v/c's to complete non-reciprocal stair negotiation pt complete reciprocal. Max directional v/c's for appropriate descend technique. Pt attempting to put all weight through bilat UEs and swing down to the step. Pt instructed to put L LE down first (weaker leg) and then the R LE (stronger leg). Stair Management Technique: One rail Left;Forwards;Alternating pattern (HHA on Right) Number of Stairs: 12    Exercises     PT Diagnosis: Difficulty walking;Generalized weakness  PT Problem List: Decreased strength;Decreased mobility PT Treatment Interventions: Gait training;Stair training;Functional mobility training;Therapeutic activities;Therapeutic exercise   PT Goals Acute Rehab PT Goals PT Goal Formulation: With patient Time For Goal Achievement: 07/29/12 Potential to  Achieve Goals: Good Pt will go Supine/Side to Sit: Independently;with HOB 0 degrees PT Goal: Supine/Side to Sit - Progress: Goal  set today Pt will go Sit to Supine/Side: Independently;with HOB 0 degrees PT Goal: Sit to Supine/Side - Progress: Goal set today Pt will go Sit to Stand: with modified independence;with upper extremity assist PT Goal: Sit to Stand - Progress: Goal set today Pt will Ambulate: >150 feet;with modified independence;with least restrictive assistive device PT Goal: Ambulate - Progress: Goal set today Pt will Go Up / Down Stairs: Flight;with modified independence;with rail(s) PT Goal: Up/Down Stairs - Progress: Goal set today  Visit Information  Last PT Received On: 07/22/12 Assistance Needed: +1    Subjective Data  Subjective: Pt received supine in bed with report "I just want to go home." Patient Stated Goal: home   Prior Functioning  Home Living Lives With: Spouse Available Help at Discharge: Family;Available 24 hours/day (spouse can take the rest of the week off ) Type of Home: House Home Access: Level entry Home Layout: Two level;Bed/bath upstairs Alternate Level Stairs-Number of Steps: 15 Alternate Level Stairs-Rails: Left Bathroom Shower/Tub: Engineer, manufacturing systems: Standard Bathroom Accessibility: Yes How Accessible: Accessible via walker Home Adaptive Equipment: None Prior Function Level of Independence: Independent Able to Take Stairs?: Yes Driving: Yes Vocation: Full time employment Comments: was independent until last week when she has ex lap for ovarian cysts Communication Communication: No difficulties Dominant Hand: Right    Cognition  Cognition Overall Cognitive Status: Appears within functional limits for tasks assessed/performed Arousal/Alertness: Awake/alert Orientation Level: Oriented X4 / Intact Behavior During Session: [facility] for tasks performed    Extremity/Trunk Assessment Right Upper Extremity Assessment RUE ROM/Strength/Tone: Within functional levels RUE Sensation: WFL - Light Touch RUE Coordination: WFL - gross/fine motor Left Upper  Extremity Assessment LUE ROM/Strength/Tone: Within functional levels LUE Sensation: WFL - Light Touch Right Lower Extremity Assessment RLE ROM/Strength/Tone: Deficits RLE ROM/Strength/Tone Deficits: inconsistent efforts and demo of strength. when asked to move in bed pt with 1/5 strength grossly, when asked in sitting pt atleast 3/5 grossly. Pt able to complete full set of stairs with only use of L UE on hand rail indicated atlest 3+/5 strength t/o RLE Sensation: WFL - Light Touch Left Lower Extremity Assessment LLE ROM/Strength/Tone: Deficits LLE ROM/Strength/Tone Deficits: inconsistent efforts and demo of strength. when asked to move in bed pt with 1/5 strength grossly, when asked in sitting pt atleast 3/5 grossly. Pt able to complete full set of stairs with only use of L UE on hand rail indic LLE Sensation: WFL - Light Touch Trunk Assessment Trunk Assessment: Normal   Balance    End of Session PT - End of Session Equipment Utilized During Treatment: Gait belt Activity Tolerance: Patient tolerated treatment well Patient left: in chair;with call bell/phone within reach;with family/visitor present Nurse Communication: Mobility status  GP Functional Assessment Tool Used: clincial judgement Functional Limitation: Mobility: Walking and moving around Mobility: Walking and Moving Around Current Status [id number]): At least 20 percent but less than 40 percent impaired, limited or restricted Mobility: Walking and Moving Around Goal Status [phone]): 0 percent impaired, limited or restricted   Marcene Brawn 07/22/2012, 4:24 PM  Lewis Shock, PT, DPT Pager #: [phone] Office #: [phone]

## 2012-07-22 NOTE — ED Provider Notes (Signed)
Medical screening examination/treatment/procedure(s) were performed by non-physician practitioner and as supervising physician I was immediately available for consultation/collaboration.   Gwyneth Sprout, MD 07/22/12 [phone]

## 2012-07-22 NOTE — ED Notes (Signed)
Patient currently resting quietly in bed; no respiratory or acute distress noted.  Admitting MD (hospitalist) currently at bedside explaining plan of care; will continue to monitor.

## 2012-07-22 NOTE — Progress Notes (Signed)
[facility]  RN called to notify me that he patient passed a small blood clot while urinating. She suspects this was likely coming from the vagina.  Calvert Cantor, MD

## 2012-07-22 NOTE — Progress Notes (Signed)
Patient passed another blot clot after using the bathroom, no active bleeding noted . Alert and oriented, MD notified via amion.

## 2012-07-22 NOTE — Progress Notes (Signed)
Patient went to the bathroom and noted  A blood clot in the commode, MD made ware. Inform patient to monitor for further bleeding.

## 2012-07-22 NOTE — Progress Notes (Signed)
[facility] hospitalists  CT abdomen and pelvis reviewed. 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  Situation discussed with patient and husband in person. The patient is not on any oral contraceptives and does not smoke. There is no history of clotting disorders in her family. Husband states that her pain is been on and off for a year but just became severe over the past couple of months. Unfortunately she is past 2 clots which appear to be coming from her vagina. At this point I am going to go ahead and start heparin however if vaginal bleeding worsens, may have to stop heparin and opt for an IVC filter. I've ordered a vaginal ultrasound as well. Prior to starting heparin I would like a stat anticoagulation panel drawn.  Findings and plan explained to husband and patient and questions answered. They agree with the current plan.

## 2012-07-22 NOTE — Progress Notes (Signed)
ANTICOAGULATION CONSULT NOTE - Initial Consult  Pharmacy Consult for Heparin Indication: Portal vein thrombosis   No Known Allergies  Patient Measurements: Height: 5\' 2"  (157.5 cm) Weight: 112 lb (50.803 kg) IBW/kg (Calculated) : 50.1 Heparin Dosing Weight: 50 kg  Vital Signs: Temp: 97.9 F (36.6 C) (02/10 1821) Temp src: Oral (02/10 1821) BP: 103/64 mmHg (02/10 1821) Pulse Rate: 76 (02/10 1821)  Labs:  Recent Labs  07/21/12 2217  HGB 12.6  HCT 36.8  PLT 144*  CREATININE 0.65    Estimated Creatinine Clearance: 77.6 ml/min (by C-G formula based on Cr of 0.65).   Medical History: Past Medical History  Diagnosis Date  . No pertinent past medical history     Assessment: 19 YOF presented with LE weakness and blurry vision, symptoms onset after an ex-lap on 2/7. MRI of the brain, cervical/thoracic spine are within normal limit, but Abd CT revealed portal vein thrombosis. Pharmacy is consulted to start IV heparin without bolus. Basline hgb 12.6, plt 144, Patient doesn't take any anticoagulants prior to admission.   Goal of Therapy:  Heparin level 0.3-0.7 units/ml Monitor platelets by anticoagulation protocol: Yes   Plan:  - Start Heparin infusion 800 units/hr - 6 hr Heparin level at 0200 - Daily heparin level and CBC - F/u plan for long-term anticoagulation  Bayard Hugger, PharmD, BCPS  Clinical Pharmacist  Pager: [phone]   07/22/2012,6:58 PM

## 2012-07-22 NOTE — ED Notes (Signed)
Report given to Tresa Endo, RN on 6N.  No further questions/concerns from RN.  Informed RN that she can call back with any questions/concerns once patient arrives to floor.  Preparing patient for transport.

## 2012-07-22 NOTE — ED Notes (Signed)
Calling report now. 

## 2012-07-23 DIAGNOSIS — I81 Portal vein thrombosis: Secondary | ICD-10-CM | POA: Diagnosis present

## 2012-07-23 LAB — HOMOCYSTEINE: Homocysteine: 8.7 umol/L (ref 4.0–15.4)

## 2012-07-23 LAB — CBC
HCT: 35.9 % — ABNORMAL LOW (ref 36.0–46.0)
Hemoglobin: 11.8 g/dL — ABNORMAL LOW (ref 12.0–15.0)
MCH: 29.1 pg (ref 26.0–34.0)
MCHC: 32.9 g/dL (ref 30.0–36.0)
MCV: 88.4 fL (ref 78.0–100.0)
Platelets: 151 10*3/uL (ref 150–400)
RBC: 4.06 MIL/uL (ref 3.87–5.11)
RDW: 12.3 % (ref 11.5–15.5)
WBC: 4.6 10*3/uL (ref 4.0–10.5)

## 2012-07-23 LAB — LUPUS ANTICOAGULANT PANEL
DRVVT: 28.9 secs (ref <42.9)
Lupus Anticoagulant: NOT DETECTED
PTT Lupus Anticoagulant: 29.9 secs (ref 28.0–43.0)

## 2012-07-23 LAB — PROTIME-INR
INR: 1.03 (ref 0.00–1.49)
Prothrombin Time: 13.4 seconds (ref 11.6–15.2)

## 2012-07-23 LAB — BETA-2-GLYCOPROTEIN I ABS, IGG/M/A
Beta-2 Glyco I IgG: 0 G Units (ref <20)
Beta-2-Glycoprotein I IgA: 11 A Units (ref <20)
Beta-2-Glycoprotein I IgM: 8 M Units (ref <20)

## 2012-07-23 LAB — PROTEIN S ACTIVITY: Protein S Activity: 53 % — ABNORMAL LOW (ref 69–129)

## 2012-07-23 LAB — HEPARIN LEVEL (UNFRACTIONATED)
Heparin Unfractionated: 0.32 IU/mL (ref 0.30–0.70)
Heparin Unfractionated: 0.38 IU/mL (ref 0.30–0.70)

## 2012-07-23 LAB — CARDIOLIPIN ANTIBODIES, IGG, IGM, IGA
Anticardiolipin IgA: 17 APL U/mL (ref <22)
Anticardiolipin IgG: 2 GPL U/mL — ABNORMAL LOW (ref <23)
Anticardiolipin IgM: 1 MPL U/mL — ABNORMAL LOW (ref <11)

## 2012-07-23 LAB — PROTEIN C ACTIVITY: Protein C Activity: 161 % — ABNORMAL HIGH (ref 75–133)

## 2012-07-23 LAB — FACTOR 5 LEIDEN

## 2012-07-23 MED ORDER — WARFARIN VIDEO
Freq: Once | Status: AC
  Administered 2012-07-23: 18:00:00

## 2012-07-23 MED ORDER — COUMADIN BOOK
Freq: Once | Status: DC
  Filled 2012-07-23: qty 1

## 2012-07-23 MED ORDER — WARFARIN - PHARMACIST DOSING INPATIENT
Freq: Every day | Status: DC
  Administered 2012-07-23: 18:00:00

## 2012-07-23 MED ORDER — WARFARIN SODIUM 7.5 MG PO TABS
7.5000 mg | ORAL_TABLET | Freq: Once | ORAL | Status: AC
  Administered 2012-07-23: 7.5 mg via ORAL
  Filled 2012-07-23: qty 1

## 2012-07-23 NOTE — Consult Note (Signed)
Reason for Consult: Portal Vein Thrombosis Referring Physician: [facility]  [name] HPI: This is a 37 year old female with a PMH of chronic pelvic pain who was admitted with complaints of blurry vision and LE weakness.  She underwent a diagnostic laproscopy this past Friday for her pelvic pain and it was negative for any abnormalities.  The total procedure time under general anesthesia was 30 minutes and no complications were encountered.  A neurologic work up was pursued and it was negative for any focal abnormalities on the MRI scan, however, a CT scan of the abdomen/pelvis was performed with findings of a portal vein thrombosis.  Her father has a history of a DVT as well as her sister, who is 56 years old.  Her sister developed her DVT in her leg shortly after a hysterectomy.  The CT scan was negative for any evidence of inflammation or masses.  Currently she is undergoing a hypercoagulable work up.  Past Medical History  Diagnosis Date  . No pertinent past medical history     Past Surgical History  Procedure Laterality Date  . Cholecystectomy    . Laparoscopy N/A 07/19/2012    Procedure: LAPAROSCOPY DIAGNOSTIC;  Surgeon: Meriel Pica, MD;  Location: [facility];  Service: Gynecology;  Laterality: N/A;    Family History  Problem Relation Age of Onset  . Hypertension Mother     Social History:  reports that she quit smoking about 1 years ago. Her smoking use included Cigarettes. She smoked 0.00 packs per day for 5 years. She does not have any smokeless tobacco history on file. She reports that  drinks alcohol. She reports that she does not use illicit drugs.  Allergies: No Known Allergies  Medications:  Scheduled:  Continuous: . heparin 800 Units/hr (07/22/12 2227)    Results for orders placed during the hospital encounter of 07/21/12 (from the past 24 hour(s))  ANTITHROMBIN III     Status: None   Collection Time    07/22/12  7:39 PM      Result Value Range    AntiThromb III Func 97  75 - 120 %  BETA-2-GLYCOPROTEIN I ABS, IGG/M/A     Status: None   Collection Time    07/22/12  7:39 PM      Result Value Range   Beta-2 Glyco I IgG 0  <20 G Units   Beta-2-Glycoprotein I IgM 8  <20 M Units   Beta-2-Glycoprotein I IgA 11  <20 A Units  HOMOCYSTEINE     Status: None   Collection Time    07/22/12  7:39 PM      Result Value Range   Homocysteine 8.7  4.0 - 15.4 umol/L  CARDIOLIPIN ANTIBODIES, IGG, IGM, IGA     Status: Abnormal   Collection Time    07/22/12  7:39 PM      Result Value Range   Anticardiolipin IgG 2 (*) <23 GPL U/mL   Anticardiolipin IgM 1 (*) <11 MPL U/mL   Anticardiolipin IgA 17  <22 APL U/mL  CBC     Status: Abnormal   Collection Time    07/23/12  6:25 AM      Result Value Range   WBC 4.6  4.0 - 10.5 K/uL   RBC 4.06  3.87 - 5.11 MIL/uL   Hemoglobin 11.8 (*) 12.0 - 15.0 g/dL   HCT 16.1 (*) 09.6 - 04.5 %   MCV 88.4  78.0 - 100.0 fL   MCH 29.1  26.0 -  34.0 pg   MCHC 32.9  30.0 - 36.0 g/dL   RDW 16.1  09.6 - 04.5 %   Platelets 151  150 - 400 K/uL  HEPARIN LEVEL (UNFRACTIONATED)     Status: None   Collection Time    07/23/12  6:25 AM      Result Value Range   Heparin Unfractionated 0.32  0.30 - 0.70 IU/mL  HEPARIN LEVEL (UNFRACTIONATED)     Status: None   Collection Time    07/23/12  1:25 PM      Result Value Range   Heparin Unfractionated 0.38  0.30 - 0.70 IU/mL     Mr Brain Wo Contrast  07/22/2012  *RADIOLOGY REPORT*  Clinical Data:  37 year old female with unexplained sudden onset bilateral lower extremity weakness and blurred vision following exploratory laparotomy on 07/19/2012 for ovarian cysts.  Comparison:   None.  MRI HEAD WITHOUT CONTRAST  Technique:  Multiplanar, multiecho pulse sequences of the brain and surrounding structures were obtained without intravenous contrast.  Findings:  Normal cerebral volume. No restricted diffusion to suggest acute infarction.  No midline shift, mass effect, evidence of mass lesion,  ventriculomegaly, extra-axial collection or acute intracranial hemorrhage.  Cervicomedullary junction and pituitary are within normal limits.  Major intracranial vascular flow voids are preserved. Wallace Cullens and white matter signal is within normal limits throughout the brain.  Cervical spine findings are described below.  Visualized orbit soft tissues are within normal limits.  Visualized paranasal sinuses and mastoids are clear.  Negative scalp soft tissues. Visualized bone marrow signal is within normal limits.  IMPRESSION:   1. Normal noncontrast MRI appearance of the brain. 2.  Cervical and thoracic spine findings are below.  MRI CERVICAL SPINE WITHOUT CONTRAST  Technique:  Multiplanar and multiecho pulse sequences of the cervic al spine, to include the craniocervical junction and cervicothoraci c junction, were obtained according to standard protocol without intravenous contrast.  Findings:  Cervicomedullary junction is within normal limits. Normal cervical spinal cord signal and morphology.  Normal cervical vertebral height and alignment. No marrow edema or evidence of acute osseous abnormality.  Visualized paraspinal soft tissues are within normal limits.  No significant cervical spine degenerative changes.  No spinal stenosis or neural impingement.  IMPRESSION: 1.  Normal noncontrast MRI appearance of the cervical spine. 2.  Thoracic spine findings are below.  MRI THORACIC SPINE WITHOUT CONTRAST  Technique: Multiplanar and multiecho pulse sequences of the thoracic spine were obtained without intravenous contrast.  Findings:  Normal thoracic vertebral height and alignment. No marrow edema or evidence of acute osseous abnormality.  Normal bone marrow signal. No marrow edema or evidence of acute osseous abnormality.  Visualized paraspinal soft tissues are within normal limits.  Trace layering pleural effusions.  Otherwise negative visualized thoracic viscera negative visualized upper abdominal viscera.  Normal  thoracic spinal cord signal and morphology.  The conus medullaris occurs below the L1 level and is only partially visualized.  Normal thoracic spinal canal patency.  No thoracic spine disc degeneration.  No spinal or foraminal stenosis.  IMPRESSION: 1.  Normal noncontrast MRI appearance of the thoracic spine. 2.  Trace layering pleural effusions.   Original Report Authenticated By: Erskine Speed, M.D.    Mr Cervical Spine Wo Contrast  07/22/2012  *RADIOLOGY REPORT*  Clinical Data:  37 year old female with unexplained sudden onset bilateral lower extremity weakness and blurred vision following exploratory laparotomy on 07/19/2012 for ovarian cysts.  Comparison:   None.  MRI HEAD WITHOUT CONTRAST  Technique:  Multiplanar, multiecho pulse sequences of the brain and surrounding structures were obtained without intravenous contrast.  Findings:  Normal cerebral volume. No restricted diffusion to suggest acute infarction.  No midline shift, mass effect, evidence of mass lesion, ventriculomegaly, extra-axial collection or acute intracranial hemorrhage.  Cervicomedullary junction and pituitary are within normal limits.  Major intracranial vascular flow voids are preserved. Wallace Cullens and white matter signal is within normal limits throughout the brain.  Cervical spine findings are described below.  Visualized orbit soft tissues are within normal limits.  Visualized paranasal sinuses and mastoids are clear.  Negative scalp soft tissues. Visualized bone marrow signal is within normal limits.  IMPRESSION:   1. Normal noncontrast MRI appearance of the brain. 2.  Cervical and thoracic spine findings are below.  MRI CERVICAL SPINE WITHOUT CONTRAST  Technique:  Multiplanar and multiecho pulse sequences of the cervic al spine, to include the craniocervical junction and cervicothoraci c junction, were obtained according to standard protocol without intravenous contrast.  Findings:  Cervicomedullary junction is within normal limits. Normal  cervical spinal cord signal and morphology.  Normal cervical vertebral height and alignment. No marrow edema or evidence of acute osseous abnormality.  Visualized paraspinal soft tissues are within normal limits.  No significant cervical spine degenerative changes.  No spinal stenosis or neural impingement.  IMPRESSION: 1.  Normal noncontrast MRI appearance of the cervical spine. 2.  Thoracic spine findings are below.  MRI THORACIC SPINE WITHOUT CONTRAST  Technique: Multiplanar and multiecho pulse sequences of the thoracic spine were obtained without intravenous contrast.  Findings:  Normal thoracic vertebral height and alignment. No marrow edema or evidence of acute osseous abnormality.  Normal bone marrow signal. No marrow edema or evidence of acute osseous abnormality.  Visualized paraspinal soft tissues are within normal limits.  Trace layering pleural effusions.  Otherwise negative visualized thoracic viscera negative visualized upper abdominal viscera.  Normal thoracic spinal cord signal and morphology.  The conus medullaris occurs below the L1 level and is only partially visualized.  Normal thoracic spinal canal patency.  No thoracic spine disc degeneration.  No spinal or foraminal stenosis.  IMPRESSION: 1.  Normal noncontrast MRI appearance of the thoracic spine. 2.  Trace layering pleural effusions.   Original Report Authenticated By: Erskine Speed, M.D.    Mr Thoracic Spine Wo Contrast  07/22/2012  *RADIOLOGY REPORT*  Clinical Data:  37 year old female with unexplained sudden onset bilateral lower extremity weakness and blurred vision following exploratory laparotomy on 07/19/2012 for ovarian cysts.  Comparison:   None.  MRI HEAD WITHOUT CONTRAST  Technique:  Multiplanar, multiecho pulse sequences of the brain and surrounding structures were obtained without intravenous contrast.  Findings:  Normal cerebral volume. No restricted diffusion to suggest acute infarction.  No midline shift, mass effect,  evidence of mass lesion, ventriculomegaly, extra-axial collection or acute intracranial hemorrhage.  Cervicomedullary junction and pituitary are within normal limits.  Major intracranial vascular flow voids are preserved. Wallace Cullens and white matter signal is within normal limits throughout the brain.  Cervical spine findings are described below.  Visualized orbit soft tissues are within normal limits.  Visualized paranasal sinuses and mastoids are clear.  Negative scalp soft tissues. Visualized bone marrow signal is within normal limits.  IMPRESSION:   1. Normal noncontrast MRI appearance of the brain. 2.  Cervical and thoracic spine findings are below.  MRI CERVICAL SPINE WITHOUT CONTRAST  Technique:  Multiplanar and multiecho pulse sequences of the cervic al spine, to include the craniocervical junction and  cervicothoraci c junction, were obtained according to standard protocol without intravenous contrast.  Findings:  Cervicomedullary junction is within normal limits. Normal cervical spinal cord signal and morphology.  Normal cervical vertebral height and alignment. No marrow edema or evidence of acute osseous abnormality.  Visualized paraspinal soft tissues are within normal limits.  No significant cervical spine degenerative changes.  No spinal stenosis or neural impingement.  IMPRESSION: 1.  Normal noncontrast MRI appearance of the cervical spine. 2.  Thoracic spine findings are below.  MRI THORACIC SPINE WITHOUT CONTRAST  Technique: Multiplanar and multiecho pulse sequences of the thoracic spine were obtained without intravenous contrast.  Findings:  Normal thoracic vertebral height and alignment. No marrow edema or evidence of acute osseous abnormality.  Normal bone marrow signal. No marrow edema or evidence of acute osseous abnormality.  Visualized paraspinal soft tissues are within normal limits.  Trace layering pleural effusions.  Otherwise negative visualized thoracic viscera negative visualized upper  abdominal viscera.  Normal thoracic spinal cord signal and morphology.  The conus medullaris occurs below the L1 level and is only partially visualized.  Normal thoracic spinal canal patency.  No thoracic spine disc degeneration.  No spinal or foraminal stenosis.  IMPRESSION: 1.  Normal noncontrast MRI appearance of the thoracic spine. 2.  Trace layering pleural effusions.   Original Report Authenticated By: Erskine Speed, M.D.    US Transvaginal Non-ob  07/22/2012  *RADIOLOGY REPORT*  Clinical Data: Passing vaginal clots; patient needs heparin for portal vein thrombosis.  TRANSABDOMINAL AND TRANSVAGINAL ULTRASOUND OF PELVIS Technique:  Both transabdominal and transvaginal ultrasound examinations of the pelvis were performed. Transabdominal technique was performed for global imaging of the pelvis including uterus, ovaries, adnexal regions, and pelvic cul-de-sac.  It was necessary to proceed with endovaginal exam following the transabdominal exam to visualize the ovaries in greater detail.  Comparison:  None  Findings:  Uterus: Normal in size and appearance; measures 8.0 x 3.2 x 4.0 cm.  Endometrium: Normal in thickness and appearance; measures 1.0 cm in thickness.  Right ovary:  Normal appearance/no adnexal mass; measures 3.3 x 2.5 x 2.1 cm.  Left ovary: Normal appearance/no adnexal mass; measures 3.6 x 2.5 x 1.8 cm.  Other findings: No free fluid is seen within the pelvic cul-de-sac.  Note is made of prominence of the periuterine vasculature; given apparent reflux of contrast along the ovarian veins on CT, this could reflect pelvic congestion syndrome in the appropriate clinical situation.  IMPRESSION:  1.  No abnormality seen to correspond to the patient's passage of clots; the uterus is unremarkable in appearance. 2.  Prominent periuterine vasculature noted; given apparent reflux of contrast along the ovarian veins on recent CT, this could reflect pelvic congestion syndrome in the appropriate clinical situation.   Ovaries unremarkable in appearance.   Original Report Authenticated By: Tonia Ghent, M.D.    US Pelvis Complete  07/22/2012  *RADIOLOGY REPORT*  Clinical Data: Passing vaginal clots; patient needs heparin for portal vein thrombosis.  TRANSABDOMINAL AND TRANSVAGINAL ULTRASOUND OF PELVIS Technique:  Both transabdominal and transvaginal ultrasound examinations of the pelvis were performed. Transabdominal technique was performed for global imaging of the pelvis including uterus, ovaries, adnexal regions, and pelvic cul-de-sac.  It was necessary to proceed with endovaginal exam following the transabdominal exam to visualize the ovaries in greater detail.  Comparison:  None  Findings:  Uterus: Normal in size and appearance; measures 8.0 x 3.2 x 4.0 cm.  Endometrium: Normal in thickness and appearance; measures 1.0 cm in  thickness.  Right ovary:  Normal appearance/no adnexal mass; measures 3.3 x 2.5 x 2.1 cm.  Left ovary: Normal appearance/no adnexal mass; measures 3.6 x 2.5 x 1.8 cm.  Other findings: No free fluid is seen within the pelvic cul-de-sac.  Note is made of prominence of the periuterine vasculature; given apparent reflux of contrast along the ovarian veins on CT, this could reflect pelvic congestion syndrome in the appropriate clinical situation.  IMPRESSION:  1.  No abnormality seen to correspond to the patient's passage of clots; the uterus is unremarkable in appearance. 2.  Prominent periuterine vasculature noted; given apparent reflux of contrast along the ovarian veins on recent CT, this could reflect pelvic congestion syndrome in the appropriate clinical situation.  Ovaries unremarkable in appearance.   Original Report Authenticated By: Tonia Ghent, M.D.    Ct Abdomen Pelvis W Contrast  07/22/2012  *RADIOLOGY REPORT*  Clinical Data: Left lower quadrant pain, status post diagnostic laparoscopy for ovarian cysts, prior cholecystectomy  CT ABDOMEN AND PELVIS WITH CONTRAST  Technique:   Multidetector CT imaging of the abdomen and pelvis was performed following the standard protocol during bolus administration of intravenous contrast.  Contrast: OMNIPAQUE IOHEXOL 300 MG/ML  SOLN  Comparison: 10/10/2010  Findings: Lung bases are clear.  Filling defect within branches of the anterior right portal vein leading to the right hepatic dome (series 2/image 20).  Heterogeneous appearance of the SMV could reflect mixing artifact (series 2/image 34), however the unopacified jejunal branches within the left lower quadrant are also mildly increased in caliber (series 2/image 39).  This raises concern for possible additional thrombus.  Liver, spleen, pancreas, and adrenal glands are otherwise within normal limits.  Status post cholecystectomy.  No intrahepatic or extrahepatic ductal dilatation.  6 mm left renal cyst (series 2/image 30).  No hydronephrosis.  No evidence of bowel obstruction.  No associated changes in the bowel to suggest ischemia.  Normal appendix.  No evidence of abdominal aortic aneurysm.  Suspicious abdominopelvic lymphadenopathy.  Trace pelvic ascites, likely physiologic.  Uterus and bilateral ovaries are unremarkable.  Bladder is within normal limits.  Mild postsurgical changes in the anterior abdominal wall.  Visualized osseous structures are within normal limits.  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  Possible thrombus within jejunal mesenteric branches in the left lower quadrant (versus mixing artifact).  No associated changes in the bowel to suggest ischemia.  These results will be called to the ordering clinician or representative by the Radiologist Assistant, and communication documented in the PACS Dashboard.   Original Report Authenticated By: Charline Bills, M.D.     ROS:  As stated above in the HPI otherwise negative.  Blood pressure 93/62, pulse 84, temperature 98.1 F (36.7 C), temperature source Oral, resp. rate 18,  height 5\' 2"  (1.575 m), weight 112 lb (50.803 kg), last menstrual period 05/28/2012, SpO2 100.00%.    PE: Gen: NAD, Alert and Oriented HEENT:  [place]/AT, EOMI Neck: Supple, no LAD Lungs: CTA Bilaterally CV: RRR without M/G/R ABM: Soft, NTND, +BS Ext: No C/C/E  Assessment/Plan: 1) Portal Vein Thrombosis. 2) Chronic pelvic pain. 3) Neurologic issues - resolved.   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  She has a predisposition to thrombosis and the surgery precipitated this issue.  Her current hypercoagulable work up is under way and she is on anticoagulation.  No evidence of any malignancy or inflammation on the CT scan, however, there was a mention of suspicious abdominal  LAD.  This will need to be reevaluated in the near future with a repeat CT scan, which will allow a follow up assessment of her PVT.  Plan: 1) Agree with anticoagulation work up and anticoagulation. 2) Recommend Hematology consultation outpatient versus inpatient for long term management of her anticoagulation and possible further work up for hypercoagulability. 3) No further GI intervention at this time.  Signing off.  Please call with any questions.  HUNG,PATRICK D 07/23/2012, 3:15 PM

## 2012-07-23 NOTE — Progress Notes (Signed)
Physical Therapy Treatment Patient Details Name: Kathy Wu MRN: [id number] DOB: July 14, 1975 Today's Date: 07/23/2012 Time: 4098-1191 PT Time Calculation (min): 18 min  PT Assessment / Plan / Recommendation Comments on Treatment Session  Pt demonstrates improvements in functional mobility (attributes prior deficits to pain medication per pt).  Pt at Independent levels for ambulation, stair negotiation.  Feel patient is safe for d/c home. No further PT needs acutely. Will sign off.    Follow Up Recommendations  Supervision/Assistance - 24 hour     Does the patient have the potential to tolerate intense rehabilitation     Barriers to Discharge None      Equipment Recommendations  Rolling walker with 5" wheels    Recommendations for Other Services    Frequency Min 3X/week   Plan All goals met and education completed, patient dischaged from PT services    Precautions / Restrictions Precautions Precautions: Fall Restrictions Weight Bearing Restrictions: No   Pertinent Vitals/Pain No significant pain reported    Mobility  Bed Mobility Bed Mobility: Supine to Sit Supine to Sit: 6: Modified independent (Device/Increase time);HOB flat Details for Bed Mobility Assistance: pt used UEs to assist LEs off bed. pt with noted quad set and maintained bilat knees lock in extension off the edge of the bed Transfers Transfers: Sit to Stand;Stand to Sit Sit to Stand: 7: Independent;With upper extremity assist Stand to Sit: 7: Independent Ambulation/Gait Ambulation/Gait Assistance: 7: Independent Ambulation Distance (Feet): 200 Feet Assistive device: Rolling walker Ambulation/Gait Assistance Details: no assist required Gait Pattern: Step-through pattern;Decreased stride length (increased UE WBing) Gait velocity: extremely slow General Gait Details: WFL Stairs: Yes Stairs Assistance: 7: Independent Stairs Assistance Details (indicate cue type and reason): no assist required Stair  Management Technique: One rail Left;Forwards;Alternating pattern Number of Stairs: 8        PT Diagnosis: Difficulty walking;Generalized weakness  PT Problem List: Decreased strength;Decreased mobility PT Treatment Interventions: Gait training;Stair training;Functional mobility training;Therapeutic activities;Therapeutic exercise   PT Goals Acute Rehab PT Goals PT Goal Formulation: With patient Time For Goal Achievement: 07/29/12 Potential to Achieve Goals: Good Pt will go Supine/Side to Sit: Independently;with HOB 0 degrees PT Goal: Supine/Side to Sit - Progress: Met Pt will go Sit to Supine/Side: Independently;with HOB 0 degrees PT Goal: Sit to Supine/Side - Progress: Met Pt will go Sit to Stand: with modified independence;with upper extremity assist PT Goal: Sit to Stand - Progress: Met Pt will Ambulate: >150 feet;with modified independence;with least restrictive assistive device PT Goal: Ambulate - Progress: Met Pt will Go Up / Down Stairs: Flight;with modified independence;with rail(s) PT Goal: Up/Down Stairs - Progress: Met  Visit Information  Last PT Received On: 07/23/12 Assistance Needed: +1    Subjective Data  Subjective: Pt received supine in bed with report "I just want to go home." Patient Stated Goal: home   Cognition  Cognition Overall Cognitive Status: Appears within functional limits for tasks assessed/performed Arousal/Alertness: Awake/alert Orientation Level: Oriented X4 / Intact Behavior During Session: [facility] for tasks performed    Balance  High Level Balance High Level Balance Activites: Side stepping;Direction changes;Turns;Sudden stops;Head turns High Level Balance Comments: Pt also able to pick object off the ground, stand with eyes closed independently without LOB.  End of Session PT - End of Session Equipment Utilized During Treatment: Gait belt Activity Tolerance: Patient tolerated treatment well Patient left: in chair;with call bell/phone  within reach;with family/visitor present Nurse Communication: Mobility status   GP Functional Assessment Tool Used: clincial judgement Functional Limitation:  Mobility: Walking and moving around Mobility: Walking and Moving Around Current Status [phone]): At least 1 percent but less than 20 percent impaired, limited or restricted Mobility: Walking and Moving Around Goal Status [phone]): 0 percent impaired, limited or restricted   Fabio Asa 07/23/2012, 1:10 PM Charlotte Crumb, PT DPT  [phone]

## 2012-07-23 NOTE — Progress Notes (Signed)
ANTICOAGULATION CONSULT NOTE - Initial Consult  Pharmacy Consult for Coumadin Indication: portal vein thrombosis  No Known Allergies  Patient Measurements: Height: 5\' 2"  (157.5 cm) Weight: 112 lb (50.803 kg) IBW/kg (Calculated) : 50.1 Heparin Dosing Weight:   Vital Signs: Temp: 98.1 F (36.7 C) (02/11 1408) Temp src: Oral (02/11 1408) BP: 93/62 mmHg (02/11 1408) Pulse Rate: 84 (02/11 1408)  Labs:  Recent Labs  07/21/12 2217 07/23/12 0625 07/23/12 1325  HGB 12.6 11.8*  --   HCT 36.8 35.9*  --   PLT 144* 151  --   HEPARINUNFRC  --  0.32 0.38  CREATININE 0.65  --   --     Estimated Creatinine Clearance: 77.6 ml/min (by C-G formula based on Cr of 0.65).   Medical History: Past Medical History  Diagnosis Date  . No pertinent past medical history     Medications:  Scheduled:  . warfarin  7.5 mg Oral ONCE-1800  . Warfarin - Pharmacist Dosing Inpatient   Does not apply q1800    Assessment: 37 yr old female presented with LE weakness and blurry vision, symptom onset after an ex-lap on 2/7. Abd CT revealed portal vein thrombosis.  Goal of Therapy:  INR 2-3 Monitor platelets by anticoagulation protocol: Yes   Plan:  Coumadin 7.5 mg tonight INR now and qAM Pt is also on heparin at 800 units/hr. Will order coumadin booklet and video for pt.  Eugene Garnet 07/23/2012,4:35 PM

## 2012-07-23 NOTE — Progress Notes (Signed)
Pt had small blood clot heparin gtt at 8 ml/hr will continue to monitor. Ilean Skill LPN

## 2012-07-23 NOTE — Progress Notes (Signed)
ANTICOAGULATION CONSULT NOTE - Follow Up Consult  Pharmacy Consult for heparin Indication: portal vein thrombosis  No Known Allergies  Patient Measurements: Height: 5\' 2"  (157.5 cm) Weight: 112 lb (50.803 kg) IBW/kg (Calculated) : 50.1 Heparin Dosing Weight:    Vital Signs: Temp: 98.1 F (36.7 C) (02/11 1408) Temp src: Oral (02/11 1408) BP: 93/62 mmHg (02/11 1408) Pulse Rate: 84 (02/11 1408)  Labs:  Recent Labs  07/21/12 2217 07/23/12 0625 07/23/12 1325  HGB 12.6 11.8*  --   HCT 36.8 35.9*  --   PLT 144* 151  --   HEPARINUNFRC  --  0.32 0.38  CREATININE 0.65  --   --     Estimated Creatinine Clearance: 77.6 ml/min (by C-G formula based on Cr of 0.65).  Assessment: 64 YOF presented with LE weakness and blurry vision, symptoms onset after an ex-lap on 2/7. MRI of the brain, cervical/thoracic spine are within normal limit, but Abd CT revealed portal vein thrombosis.Basline hgb 12.6, plt 144  AC: Portal vein thrombosis. Hgb down slightly to 11.8 today. Heparin level 0.38 remains in goal range. RN notes indicate patient has passed several small blood clots while urinating. Not determined if these are definitely urinary or vaginal.   Goal of Therapy:  Heparin level 0.3-0.7 units/ml Monitor platelets by anticoagulation protocol: Yes   Plan:  Continue IV heparin at 800 units/hr Next HL with am labs.  Misty Stanley Stillinger 07/23/2012,2:29 PM

## 2012-07-23 NOTE — Progress Notes (Signed)
Md in for rounds made aware that patient started to have her monthly period.No new order, will monitor bleeding.

## 2012-07-23 NOTE — Progress Notes (Addendum)
[facility] PROGRESS NOTE  Kathy Wu ZOX:[id number] DOB: 01-03-1976 DOA: 07/21/2012 PCP: Provider Not In System  Assessment/Plan: Portal vein thrombosis  as seen on CT abdomen and pelvis. No clear etiology. Patient denies heavy etoh use. LFTs wnl. No hx suggestive of malignancy. Does informs both her father and sister having blood clots. CT also comments on abdominopelvic lymph nodes which will warrant follow up CT in future. Started on heparin drip . GI consulted who  recommended getting heme evaluating for long term anticoagulation.  hypercoagulable w/up pending. i discussed the patient with Dr Cyndie Chime who recommended her to be started on coumadin. i have called the coumadin clinic to set up an appointment for her as Dr Cyndie Chime will not be able to see her within next 1-2 weeks. Patient will be called from coumadin clinic for follow up within  Week. -Dr Cyndie Chime also recommends getting doppler studies of the portal vein to better visualize the acuity of thrombus.  -could possibly be discharged on coumadin following doppler studies and hypercoagulable results if coumadin clinic appointment within the next day or two could be established. Please note that the progress note with recommendations  from Dr Elnoria Howard today was entered in error.  Please refer to consult note only.  abdominal pain  had RLQ pain which does not correlate with the portal vein thrombus. currently stable. recent laparoscopy and imaging negative for any localized lesion except for findings noted above.  Vagina bleed  noted on 2/10. Appears in the  setting o menstruation.  B/l lower extremity weakness  resolved. MRI on the brain and spine unremarkable. No clear etiology. Neuro signed off.      Code Status: full Family Communication: husband at bedside Disposition Plan: home likely in 1-2 days   Consultants: Neurology Dr Elnoria Howard ( GI)  d/w Dr Cyndie Chime over the phone. Please call if any  questions  Procedures:  none  Antibiotics:  none  HPI/Subjective: LE weakness resolved. Had vaginal blled with passage of clots which she attributes to menstrual bleed.   Objective: Filed Vitals:   07/22/12 1821 07/22/12 2109 07/23/12 0500 07/23/12 1408  BP: 103/64 94/56 106/62 93/62  Pulse: 76 73 74 84  Temp: 97.9 F (36.6 C) 98.2 F (36.8 C) 98.1 F (36.7 C) 98.1 F (36.7 C)  TempSrc: Oral   Oral  Resp: 16 16 18 18   Height:      Weight:      SpO2: 100% 100% 100% 100%    Intake/Output Summary (Last 24 hours) at 07/23/12 1554 Last data filed at 07/23/12 1408  Gross per 24 hour  Intake 605.47 ml  Output    200 ml  Net 405.47 ml   Filed Weights   07/22/12 0123  Weight: 50.803 kg (112 lb)    Exam:   General: middle aged female in NAD  HEENT: no palor, moist oral mucosa  Cardiovascular: NS1&S2, no murmurs  Respiratory: clear b/l, no added sounds  Abdomen: soft, NT, ND BS+  Ext: warm, no edema  CNS: AAOX3  Data Reviewed: Basic Metabolic Panel:  Recent Labs Lab 07/21/12 2217  NA 140  K 3.7  CL 102  CO2 28  GLUCOSE 72  BUN 13  CREATININE 0.65  CALCIUM 9.1   Liver Function Tests:  Recent Labs Lab 07/21/12 2217  AST 15  ALT 8  ALKPHOS 30*  BILITOT 0.5  PROT 6.8  ALBUMIN 3.7   No results found for this basename: LIPASE, AMYLASE,  in the last 168 hours No  results found for this basename: AMMONIA,  in the last 168 hours CBC:  Recent Labs Lab 07/21/12 2217 07/23/12 0625  WBC 6.7 4.6  NEUTROABS 4.4  --   HGB 12.6 11.8*  HCT 36.8 35.9*  MCV 87.0 88.4  PLT 144* 151   Cardiac Enzymes: No results found for this basename: CKTOTAL, CKMB, CKMBINDEX, TROPONINI,  in the last 168 hours BNP (last 3 results) No results found for this basename: PROBNP,  in the last 8760 hours CBG: No results found for this basename: GLUCAP,  in the last 168 hours  No results found for this or any previous visit (from the past 240 hour(s)).    Studies: Mr Brain 96 Contrast  07/22/2012  *RADIOLOGY REPORT*  Clinical Data:  37 year old female with unexplained sudden onset bilateral lower extremity weakness and blurred vision following exploratory laparotomy on 07/19/2012 for ovarian cysts.  Comparison:   None.  MRI HEAD WITHOUT CONTRAST  Technique:  Multiplanar, multiecho pulse sequences of the brain and surrounding structures were obtained without intravenous contrast.  Findings:  Normal cerebral volume. No restricted diffusion to suggest acute infarction.  No midline shift, mass effect, evidence of mass lesion, ventriculomegaly, extra-axial collection or acute intracranial hemorrhage.  Cervicomedullary junction and pituitary are within normal limits.  Major intracranial vascular flow voids are preserved. Wallace Cullens and white matter signal is within normal limits throughout the brain.  Cervical spine findings are described below.  Visualized orbit soft tissues are within normal limits.  Visualized paranasal sinuses and mastoids are clear.  Negative scalp soft tissues. Visualized bone marrow signal is within normal limits.  IMPRESSION:   1. Normal noncontrast MRI appearance of the brain. 2.  Cervical and thoracic spine findings are below.  MRI CERVICAL SPINE WITHOUT CONTRAST  Technique:  Multiplanar and multiecho pulse sequences of the cervic al spine, to include the craniocervical junction and cervicothoraci c junction, were obtained according to standard protocol without intravenous contrast.  Findings:  Cervicomedullary junction is within normal limits. Normal cervical spinal cord signal and morphology.  Normal cervical vertebral height and alignment. No marrow edema or evidence of acute osseous abnormality.  Visualized paraspinal soft tissues are within normal limits.  No significant cervical spine degenerative changes.  No spinal stenosis or neural impingement.  IMPRESSION: 1.  Normal noncontrast MRI appearance of the cervical spine. 2.  Thoracic spine  findings are below.  MRI THORACIC SPINE WITHOUT CONTRAST  Technique: Multiplanar and multiecho pulse sequences of the thoracic spine were obtained without intravenous contrast.  Findings:  Normal thoracic vertebral height and alignment. No marrow edema or evidence of acute osseous abnormality.  Normal bone marrow signal. No marrow edema or evidence of acute osseous abnormality.  Visualized paraspinal soft tissues are within normal limits.  Trace layering pleural effusions.  Otherwise negative visualized thoracic viscera negative visualized upper abdominal viscera.  Normal thoracic spinal cord signal and morphology.  The conus medullaris occurs below the L1 level and is only partially visualized.  Normal thoracic spinal canal patency.  No thoracic spine disc degeneration.  No spinal or foraminal stenosis.  IMPRESSION: 1.  Normal noncontrast MRI appearance of the thoracic spine. 2.  Trace layering pleural effusions.   Original Report Authenticated By: Erskine Speed, M.D.    Mr Cervical Spine Wo Contrast  07/22/2012  *RADIOLOGY REPORT*  Clinical Data:  37 year old female with unexplained sudden onset bilateral lower extremity weakness and blurred vision following exploratory laparotomy on 07/19/2012 for ovarian cysts.  Comparison:   None.  MRI  HEAD WITHOUT CONTRAST  Technique:  Multiplanar, multiecho pulse sequences of the brain and surrounding structures were obtained without intravenous contrast.  Findings:  Normal cerebral volume. No restricted diffusion to suggest acute infarction.  No midline shift, mass effect, evidence of mass lesion, ventriculomegaly, extra-axial collection or acute intracranial hemorrhage.  Cervicomedullary junction and pituitary are within normal limits.  Major intracranial vascular flow voids are preserved. Wallace Cullens and white matter signal is within normal limits throughout the brain.  Cervical spine findings are described below.  Visualized orbit soft tissues are within normal limits.   Visualized paranasal sinuses and mastoids are clear.  Negative scalp soft tissues. Visualized bone marrow signal is within normal limits.  IMPRESSION:   1. Normal noncontrast MRI appearance of the brain. 2.  Cervical and thoracic spine findings are below.  MRI CERVICAL SPINE WITHOUT CONTRAST  Technique:  Multiplanar and multiecho pulse sequences of the cervic al spine, to include the craniocervical junction and cervicothoraci c junction, were obtained according to standard protocol without intravenous contrast.  Findings:  Cervicomedullary junction is within normal limits. Normal cervical spinal cord signal and morphology.  Normal cervical vertebral height and alignment. No marrow edema or evidence of acute osseous abnormality.  Visualized paraspinal soft tissues are within normal limits.  No significant cervical spine degenerative changes.  No spinal stenosis or neural impingement.  IMPRESSION: 1.  Normal noncontrast MRI appearance of the cervical spine. 2.  Thoracic spine findings are below.  MRI THORACIC SPINE WITHOUT CONTRAST  Technique: Multiplanar and multiecho pulse sequences of the thoracic spine were obtained without intravenous contrast.  Findings:  Normal thoracic vertebral height and alignment. No marrow edema or evidence of acute osseous abnormality.  Normal bone marrow signal. No marrow edema or evidence of acute osseous abnormality.  Visualized paraspinal soft tissues are within normal limits.  Trace layering pleural effusions.  Otherwise negative visualized thoracic viscera negative visualized upper abdominal viscera.  Normal thoracic spinal cord signal and morphology.  The conus medullaris occurs below the L1 level and is only partially visualized.  Normal thoracic spinal canal patency.  No thoracic spine disc degeneration.  No spinal or foraminal stenosis.  IMPRESSION: 1.  Normal noncontrast MRI appearance of the thoracic spine. 2.  Trace layering pleural effusions.   Original Report Authenticated  By: Erskine Speed, M.D.    Mr Thoracic Spine Wo Contrast  07/22/2012  *RADIOLOGY REPORT*  Clinical Data:  37 year old female with unexplained sudden onset bilateral lower extremity weakness and blurred vision following exploratory laparotomy on 07/19/2012 for ovarian cysts.  Comparison:   None.  MRI HEAD WITHOUT CONTRAST  Technique:  Multiplanar, multiecho pulse sequences of the brain and surrounding structures were obtained without intravenous contrast.  Findings:  Normal cerebral volume. No restricted diffusion to suggest acute infarction.  No midline shift, mass effect, evidence of mass lesion, ventriculomegaly, extra-axial collection or acute intracranial hemorrhage.  Cervicomedullary junction and pituitary are within normal limits.  Major intracranial vascular flow voids are preserved. Wallace Cullens and white matter signal is within normal limits throughout the brain.  Cervical spine findings are described below.  Visualized orbit soft tissues are within normal limits.  Visualized paranasal sinuses and mastoids are clear.  Negative scalp soft tissues. Visualized bone marrow signal is within normal limits.  IMPRESSION:   1. Normal noncontrast MRI appearance of the brain. 2.  Cervical and thoracic spine findings are below.  MRI CERVICAL SPINE WITHOUT CONTRAST  Technique:  Multiplanar and multiecho pulse sequences of the cervic al spine,  to include the craniocervical junction and cervicothoraci c junction, were obtained according to standard protocol without intravenous contrast.  Findings:  Cervicomedullary junction is within normal limits. Normal cervical spinal cord signal and morphology.  Normal cervical vertebral height and alignment. No marrow edema or evidence of acute osseous abnormality.  Visualized paraspinal soft tissues are within normal limits.  No significant cervical spine degenerative changes.  No spinal stenosis or neural impingement.  IMPRESSION: 1.  Normal noncontrast MRI appearance of the cervical  spine. 2.  Thoracic spine findings are below.  MRI THORACIC SPINE WITHOUT CONTRAST  Technique: Multiplanar and multiecho pulse sequences of the thoracic spine were obtained without intravenous contrast.  Findings:  Normal thoracic vertebral height and alignment. No marrow edema or evidence of acute osseous abnormality.  Normal bone marrow signal. No marrow edema or evidence of acute osseous abnormality.  Visualized paraspinal soft tissues are within normal limits.  Trace layering pleural effusions.  Otherwise negative visualized thoracic viscera negative visualized upper abdominal viscera.  Normal thoracic spinal cord signal and morphology.  The conus medullaris occurs below the L1 level and is only partially visualized.  Normal thoracic spinal canal patency.  No thoracic spine disc degeneration.  No spinal or foraminal stenosis.  IMPRESSION: 1.  Normal noncontrast MRI appearance of the thoracic spine. 2.  Trace layering pleural effusions.   Original Report Authenticated By: Erskine Speed, M.D.    US Transvaginal Non-ob  07/22/2012  *RADIOLOGY REPORT*  Clinical Data: Passing vaginal clots; patient needs heparin for portal vein thrombosis.  TRANSABDOMINAL AND TRANSVAGINAL ULTRASOUND OF PELVIS Technique:  Both transabdominal and transvaginal ultrasound examinations of the pelvis were performed. Transabdominal technique was performed for global imaging of the pelvis including uterus, ovaries, adnexal regions, and pelvic cul-de-sac.  It was necessary to proceed with endovaginal exam following the transabdominal exam to visualize the ovaries in greater detail.  Comparison:  None  Findings:  Uterus: Normal in size and appearance; measures 8.0 x 3.2 x 4.0 cm.  Endometrium: Normal in thickness and appearance; measures 1.0 cm in thickness.  Right ovary:  Normal appearance/no adnexal mass; measures 3.3 x 2.5 x 2.1 cm.  Left ovary: Normal appearance/no adnexal mass; measures 3.6 x 2.5 x 1.8 cm.  Other findings: No free fluid  is seen within the pelvic cul-de-sac.  Note is made of prominence of the periuterine vasculature; given apparent reflux of contrast along the ovarian veins on CT, this could reflect pelvic congestion syndrome in the appropriate clinical situation.  IMPRESSION:  1.  No abnormality seen to correspond to the patient's passage of clots; the uterus is unremarkable in appearance. 2.  Prominent periuterine vasculature noted; given apparent reflux of contrast along the ovarian veins on recent CT, this could reflect pelvic congestion syndrome in the appropriate clinical situation.  Ovaries unremarkable in appearance.   Original Report Authenticated By: Tonia Ghent, M.D.    US Pelvis Complete  07/22/2012  *RADIOLOGY REPORT*  Clinical Data: Passing vaginal clots; patient needs heparin for portal vein thrombosis.  TRANSABDOMINAL AND TRANSVAGINAL ULTRASOUND OF PELVIS Technique:  Both transabdominal and transvaginal ultrasound examinations of the pelvis were performed. Transabdominal technique was performed for global imaging of the pelvis including uterus, ovaries, adnexal regions, and pelvic cul-de-sac.  It was necessary to proceed with endovaginal exam following the transabdominal exam to visualize the ovaries in greater detail.  Comparison:  None  Findings:  Uterus: Normal in size and appearance; measures 8.0 x 3.2 x 4.0 cm.  Endometrium: Normal in thickness  and appearance; measures 1.0 cm in thickness.  Right ovary:  Normal appearance/no adnexal mass; measures 3.3 x 2.5 x 2.1 cm.  Left ovary: Normal appearance/no adnexal mass; measures 3.6 x 2.5 x 1.8 cm.  Other findings: No free fluid is seen within the pelvic cul-de-sac.  Note is made of prominence of the periuterine vasculature; given apparent reflux of contrast along the ovarian veins on CT, this could reflect pelvic congestion syndrome in the appropriate clinical situation.  IMPRESSION:  1.  No abnormality seen to correspond to the patient's passage of clots; the  uterus is unremarkable in appearance. 2.  Prominent periuterine vasculature noted; given apparent reflux of contrast along the ovarian veins on recent CT, this could reflect pelvic congestion syndrome in the appropriate clinical situation.  Ovaries unremarkable in appearance.   Original Report Authenticated By: Tonia Ghent, M.D.    Ct Abdomen Pelvis W Contrast  07/22/2012  *RADIOLOGY REPORT*  Clinical Data: Left lower quadrant pain, status post diagnostic laparoscopy for ovarian cysts, prior cholecystectomy  CT ABDOMEN AND PELVIS WITH CONTRAST  Technique:  Multidetector CT imaging of the abdomen and pelvis was performed following the standard protocol during bolus administration of intravenous contrast.  Contrast: OMNIPAQUE IOHEXOL 300 MG/ML  SOLN  Comparison: 10/10/2010  Findings: Lung bases are clear.  Filling defect within branches of the anterior right portal vein leading to the right hepatic dome (series 2/image 20).  Heterogeneous appearance of the SMV could reflect mixing artifact (series 2/image 34), however the unopacified jejunal branches within the left lower quadrant are also mildly increased in caliber (series 2/image 39).  This raises concern for possible additional thrombus.  Liver, spleen, pancreas, and adrenal glands are otherwise within normal limits.  Status post cholecystectomy.  No intrahepatic or extrahepatic ductal dilatation.  6 mm left renal cyst (series 2/image 30).  No hydronephrosis.  No evidence of bowel obstruction.  No associated changes in the bowel to suggest ischemia.  Normal appendix.  No evidence of abdominal aortic aneurysm.  Suspicious abdominopelvic lymphadenopathy.  Trace pelvic ascites, likely physiologic.  Uterus and bilateral ovaries are unremarkable.  Bladder is within normal limits.  Mild postsurgical changes in the anterior abdominal wall.  Visualized osseous structures are within normal limits.  IMPRESSION: Portal vein thrombosis within branches of the right  anterior portal vein.  Possible thrombus within jejunal mesenteric branches in the left lower quadrant (versus mixing artifact).  No associated changes in the bowel to suggest ischemia.  These results will be called to the ordering clinician or representative by the Radiologist Assistant, and communication documented in the PACS Dashboard.   Original Report Authenticated By: Charline Bills, M.D.     Scheduled Meds:  Continuous Infusions: . heparin 800 Units/hr (07/22/12 2227)    Principal Problem:   Lower extremity weakness Active Problems:   RLQ abdominal pain    Time spent: 25 minutes    DHUNGEL, NISHANT  [facility] Pager [phone] If 8PM-8AM, please contact night-coverage at [email or web address], password [facility] 07/23/2012, 3:54 PM  LOS: 2 days

## 2012-07-23 NOTE — Evaluation (Signed)
Occupational Therapy Evaluation Patient Details Name: Kathy Wu MRN: [id number] DOB: [birth year] Today's Date: 07/23/2012 Time: [id number] OT Time Calculation (min): 15 min  OT Assessment / Plan / Recommendation Clinical Impression  Pt is a 37 yo female who arrived to ED with c/o bilat LE wkns who just had an ex-lap for ovarian cysts. Pt is independent with all ADLs and mobility.    OT Assessment  Patient does not need any further OT services    Follow Up Recommendations  No OT follow up    Barriers to Discharge      Equipment Recommendations  None recommended by OT    Recommendations for Other Services    Frequency       Precautions / Restrictions Precautions Precautions: None   Pertinent Vitals/Pain Pt denied pain    ADL  Grooming: Independent Where Assessed - Grooming: Unsupported standing Upper Body Bathing: Independent Where Assessed - Upper Body Bathing: Unsupported standing Lower Body Bathing: Independent Where Assessed - Lower Body Bathing: Unsupported standing Upper Body Dressing: Independent Where Assessed - Upper Body Dressing: Unsupported standing Lower Body Dressing: Independent Where Assessed - Lower Body Dressing: Supported standing Toilet Transfer: Independent Statistician Method: Sit to Barista: Regular height toilet Toileting - Clothing Manipulation and Hygiene: Independent Where Assessed - Engineer, mining and Hygiene: Standing Tub/Shower Transfer: Independent Tub/Shower Transfer Method: Ambulating ADL Comments: Pt ambulated around the unit of 6N independently at a quick rate without LOB.    OT Diagnosis:    OT Problem List:   OT Treatment Interventions:     OT Goals    Visit Information  Last OT Received On: 07/23/12 Assistance Needed: +1    Subjective Data  Subjective: All my symptoms are gone. Patient Stated Goal: Return home today.   Prior Functioning     Home Living Lives With:  Spouse Available Help at Discharge: Family;Available 24 hours/day Type of Home: House Home Access: Level entry Home Layout: Two level;Bed/bath upstairs Alternate Level Stairs-Number of Steps: 15 Alternate Level Stairs-Rails: Left Bathroom Shower/Tub: Engineer, manufacturing systems: Standard Bathroom Accessibility: Yes How Accessible: Accessible via walker Home Adaptive Equipment: None Prior Function Level of Independence: Independent Able to Take Stairs?: Yes Driving: Yes Vocation: Full time employment Communication Communication: No difficulties Dominant Hand: Right         Vision/Perception Vision - History Baseline Vision: Wears glasses for distance only Patient Visual Report: No change from baseline Vision - Assessment Vision Assessment: Vision not tested   Cognition  Cognition Overall Cognitive Status: Appears within functional limits for tasks assessed/performed Arousal/Alertness: Awake/alert Orientation Level: Appears intact for tasks assessed Behavior During Session: [facility] for tasks performed    Extremity/Trunk Assessment Right Upper Extremity Assessment RUE ROM/Strength/Tone: Within functional levels RUE Sensation: WFL - Light Touch;WFL - Proprioception RUE Coordination: WFL - gross/fine motor;WFL - gross motor Left Upper Extremity Assessment LUE ROM/Strength/Tone: Within functional levels LUE Sensation: WFL - Light Touch;WFL - Proprioception LUE Coordination: WFL - gross/fine motor;WFL - gross motor     Mobility Bed Mobility Supine to Sit: 7: Independent Transfers Sit to Stand: 7: Independent Stand to Sit: 7: Independent     Exercise     Balance High Level Balance High Level Balance Activites: Side stepping;Direction changes;Turns;Sudden stops;Head turns High Level Balance Comments: Pt also able to pick object off the ground, stand with eyes closed independently without LOB.   End of Session OT - End of Session Activity Tolerance: Patient  tolerated treatment well Patient left: in  chair;with call bell/phone within reach;with family/visitor present  GO Functional Assessment Tool Used: Clinical Judgement Functional Limitation: Self care Self Care Current Status (Z6109): 0 percent impaired, limited or restricted Self Care Goal Status (U0454): 0 percent impaired, limited or restricted Self Care Discharge Status [phone]): 0 percent impaired, limited or restricted   PONTONERO,LAURIE A OTR/L [phone] 07/23/2012, 10:23 AM

## 2012-07-23 NOTE — Progress Notes (Signed)
ANTICOAGULATION CONSULT NOTE - Follow Up Consult  Pharmacy Consult for heparin Indication: portal vein thrombosis  No Known Allergies  Patient Measurements: Height: 5\' 2"  (157.5 cm) Weight: 112 lb (50.803 kg) IBW/kg (Calculated) : 50.1 Heparin Dosing Weight:    Vital Signs: Temp: 98.1 F (36.7 C) (02/11 0500) BP: 106/62 mmHg (02/11 0500) Pulse Rate: 74 (02/11 0500)  Labs:  Recent Labs  07/21/12 2217 07/23/12 0625  HGB 12.6 11.8*  HCT 36.8 35.9*  PLT 144* 151  HEPARINUNFRC  --  0.32  CREATININE 0.65  --     Estimated Creatinine Clearance: 77.6 ml/min (by C-G formula based on Cr of 0.65).  Assessment: 31 YOF presented with LE weakness and blurry vision, symptoms onset after an ex-lap on 2/7. MRI of the brain, cervical/thoracic spine are within normal limit, but Abd CT revealed portal vein thrombosis.Basline hgb 12.6, plt 144  AC: Portal vein thrombosis. Hgb down slightly to 11.8 today. Heparin level 0.32 in goal this am. RN notes indicate patient has passed several small blood clots while urinating. Not determined if these are definitely urinary or vaginal.   Goal of Therapy:  Heparin level 0.3-0.7 units/ml Monitor platelets by anticoagulation protocol: Yes   Plan:  Continue IV heparin at 800 units/hr Recheck heparin level to confirm.  Merilynn Finland, [employer] 07/23/2012,11:19 AM

## 2012-07-23 NOTE — Progress Notes (Deleted)
Subjective: No acute events.  The patient reports feeling well.  Objective: Vital signs in last 24 hours: Temp:  [97.9 F (36.6 C)-98.2 F (36.8 C)] 98.1 F (36.7 C) (02/11 1408) Pulse Rate:  [73-84] 84 (02/11 1408) Resp:  [16-18] 18 (02/11 1408) BP: (93-106)/(56-64) 93/62 mmHg (02/11 1408) SpO2:  [100 %] 100 % (02/11 1408) Last BM Date: 07/19/12  Intake/Output from previous day: 02/10 0701 - 02/11 0700 In: 68.4 [I.V.:68.4] Out: 850 [Urine:850] Intake/Output this shift: Total I/O In: 537.1 [P.O.:480; I.V.:57.1] Out: -   General appearance: alert and no distress GI: soft, non-tender; bowel sounds normal; no masses,  no organomegaly  Lab Results:  Recent Labs  07/21/12 2217 07/23/12 0625  WBC 6.7 4.6  HGB 12.6 11.8*  HCT 36.8 35.9*  PLT 144* 151   BMET  Recent Labs  07/21/12 2217  NA 140  K 3.7  CL 102  CO2 28  GLUCOSE 72  BUN 13  CREATININE 0.65  CALCIUM 9.1   LFT  Recent Labs  07/21/12 2217  PROT 6.8  ALBUMIN 3.7  AST 15  ALT 8  ALKPHOS 30*  BILITOT 0.5   PT/INR No results found for this basename: LABPROT, INR,  in the last 72 hours Hepatitis Panel No results found for this basename: HEPBSAG, HCVAB, HEPAIGM, HEPBIGM,  in the last 72 hours C-Diff No results found for this basename: CDIFFTOX,  in the last 72 hours Fecal Lactopherrin No results found for this basename: FECLLACTOFRN,  in the last 72 hours  Studies/Results: Mr Brain Wo Contrast  07/22/2012  *RADIOLOGY REPORT*  Clinical Data:  37 year old female with unexplained sudden onset bilateral lower extremity weakness and blurred vision following exploratory laparotomy on 07/19/2012 for ovarian cysts.  Comparison:   None.  MRI HEAD WITHOUT CONTRAST  Technique:  Multiplanar, multiecho pulse sequences of the brain and surrounding structures were obtained without intravenous contrast.  Findings:  Normal cerebral volume. No restricted diffusion to suggest acute infarction.  No midline shift,  mass effect, evidence of mass lesion, ventriculomegaly, extra-axial collection or acute intracranial hemorrhage.  Cervicomedullary junction and pituitary are within normal limits.  Major intracranial vascular flow voids are preserved. Wallace Cullens and white matter signal is within normal limits throughout the brain.  Cervical spine findings are described below.  Visualized orbit soft tissues are within normal limits.  Visualized paranasal sinuses and mastoids are clear.  Negative scalp soft tissues. Visualized bone marrow signal is within normal limits.  IMPRESSION:   1. Normal noncontrast MRI appearance of the brain. 2.  Cervical and thoracic spine findings are below.  MRI CERVICAL SPINE WITHOUT CONTRAST  Technique:  Multiplanar and multiecho pulse sequences of the cervic al spine, to include the craniocervical junction and cervicothoraci c junction, were obtained according to standard protocol without intravenous contrast.  Findings:  Cervicomedullary junction is within normal limits. Normal cervical spinal cord signal and morphology.  Normal cervical vertebral height and alignment. No marrow edema or evidence of acute osseous abnormality.  Visualized paraspinal soft tissues are within normal limits.  No significant cervical spine degenerative changes.  No spinal stenosis or neural impingement.  IMPRESSION: 1.  Normal noncontrast MRI appearance of the cervical spine. 2.  Thoracic spine findings are below.  MRI THORACIC SPINE WITHOUT CONTRAST  Technique: Multiplanar and multiecho pulse sequences of the thoracic spine were obtained without intravenous contrast.  Findings:  Normal thoracic vertebral height and alignment. No marrow edema or evidence of acute osseous abnormality.  Normal bone marrow signal. No  marrow edema or evidence of acute osseous abnormality.  Visualized paraspinal soft tissues are within normal limits.  Trace layering pleural effusions.  Otherwise negative visualized thoracic viscera negative visualized  upper abdominal viscera.  Normal thoracic spinal cord signal and morphology.  The conus medullaris occurs below the L1 level and is only partially visualized.  Normal thoracic spinal canal patency.  No thoracic spine disc degeneration.  No spinal or foraminal stenosis.  IMPRESSION: 1.  Normal noncontrast MRI appearance of the thoracic spine. 2.  Trace layering pleural effusions.   Original Report Authenticated By: Erskine Speed, M.D.    Mr Cervical Spine Wo Contrast  07/22/2012  *RADIOLOGY REPORT*  Clinical Data:  37 year old female with unexplained sudden onset bilateral lower extremity weakness and blurred vision following exploratory laparotomy on 07/19/2012 for ovarian cysts.  Comparison:   None.  MRI HEAD WITHOUT CONTRAST  Technique:  Multiplanar, multiecho pulse sequences of the brain and surrounding structures were obtained without intravenous contrast.  Findings:  Normal cerebral volume. No restricted diffusion to suggest acute infarction.  No midline shift, mass effect, evidence of mass lesion, ventriculomegaly, extra-axial collection or acute intracranial hemorrhage.  Cervicomedullary junction and pituitary are within normal limits.  Major intracranial vascular flow voids are preserved. Wallace Cullens and white matter signal is within normal limits throughout the brain.  Cervical spine findings are described below.  Visualized orbit soft tissues are within normal limits.  Visualized paranasal sinuses and mastoids are clear.  Negative scalp soft tissues. Visualized bone marrow signal is within normal limits.  IMPRESSION:   1. Normal noncontrast MRI appearance of the brain. 2.  Cervical and thoracic spine findings are below.  MRI CERVICAL SPINE WITHOUT CONTRAST  Technique:  Multiplanar and multiecho pulse sequences of the cervic al spine, to include the craniocervical junction and cervicothoraci c junction, were obtained according to standard protocol without intravenous contrast.  Findings:  Cervicomedullary  junction is within normal limits. Normal cervical spinal cord signal and morphology.  Normal cervical vertebral height and alignment. No marrow edema or evidence of acute osseous abnormality.  Visualized paraspinal soft tissues are within normal limits.  No significant cervical spine degenerative changes.  No spinal stenosis or neural impingement.  IMPRESSION: 1.  Normal noncontrast MRI appearance of the cervical spine. 2.  Thoracic spine findings are below.  MRI THORACIC SPINE WITHOUT CONTRAST  Technique: Multiplanar and multiecho pulse sequences of the thoracic spine were obtained without intravenous contrast.  Findings:  Normal thoracic vertebral height and alignment. No marrow edema or evidence of acute osseous abnormality.  Normal bone marrow signal. No marrow edema or evidence of acute osseous abnormality.  Visualized paraspinal soft tissues are within normal limits.  Trace layering pleural effusions.  Otherwise negative visualized thoracic viscera negative visualized upper abdominal viscera.  Normal thoracic spinal cord signal and morphology.  The conus medullaris occurs below the L1 level and is only partially visualized.  Normal thoracic spinal canal patency.  No thoracic spine disc degeneration.  No spinal or foraminal stenosis.  IMPRESSION: 1.  Normal noncontrast MRI appearance of the thoracic spine. 2.  Trace layering pleural effusions.   Original Report Authenticated By: Erskine Speed, M.D.    Mr Thoracic Spine Wo Contrast  07/22/2012  *RADIOLOGY REPORT*  Clinical Data:  37 year old female with unexplained sudden onset bilateral lower extremity weakness and blurred vision following exploratory laparotomy on 07/19/2012 for ovarian cysts.  Comparison:   None.  MRI HEAD WITHOUT CONTRAST  Technique:  Multiplanar, multiecho pulse sequences of  the brain and surrounding structures were obtained without intravenous contrast.  Findings:  Normal cerebral volume. No restricted diffusion to suggest acute  infarction.  No midline shift, mass effect, evidence of mass lesion, ventriculomegaly, extra-axial collection or acute intracranial hemorrhage.  Cervicomedullary junction and pituitary are within normal limits.  Major intracranial vascular flow voids are preserved. Wallace Cullens and white matter signal is within normal limits throughout the brain.  Cervical spine findings are described below.  Visualized orbit soft tissues are within normal limits.  Visualized paranasal sinuses and mastoids are clear.  Negative scalp soft tissues. Visualized bone marrow signal is within normal limits.  IMPRESSION:   1. Normal noncontrast MRI appearance of the brain. 2.  Cervical and thoracic spine findings are below.  MRI CERVICAL SPINE WITHOUT CONTRAST  Technique:  Multiplanar and multiecho pulse sequences of the cervic al spine, to include the craniocervical junction and cervicothoraci c junction, were obtained according to standard protocol without intravenous contrast.  Findings:  Cervicomedullary junction is within normal limits. Normal cervical spinal cord signal and morphology.  Normal cervical vertebral height and alignment. No marrow edema or evidence of acute osseous abnormality.  Visualized paraspinal soft tissues are within normal limits.  No significant cervical spine degenerative changes.  No spinal stenosis or neural impingement.  IMPRESSION: 1.  Normal noncontrast MRI appearance of the cervical spine. 2.  Thoracic spine findings are below.  MRI THORACIC SPINE WITHOUT CONTRAST  Technique: Multiplanar and multiecho pulse sequences of the thoracic spine were obtained without intravenous contrast.  Findings:  Normal thoracic vertebral height and alignment. No marrow edema or evidence of acute osseous abnormality.  Normal bone marrow signal. No marrow edema or evidence of acute osseous abnormality.  Visualized paraspinal soft tissues are within normal limits.  Trace layering pleural effusions.  Otherwise negative visualized  thoracic viscera negative visualized upper abdominal viscera.  Normal thoracic spinal cord signal and morphology.  The conus medullaris occurs below the L1 level and is only partially visualized.  Normal thoracic spinal canal patency.  No thoracic spine disc degeneration.  No spinal or foraminal stenosis.  IMPRESSION: 1.  Normal noncontrast MRI appearance of the thoracic spine. 2.  Trace layering pleural effusions.   Original Report Authenticated By: Erskine Speed, M.D.    US Transvaginal Non-ob  07/22/2012  *RADIOLOGY REPORT*  Clinical Data: Passing vaginal clots; patient needs heparin for portal vein thrombosis.  TRANSABDOMINAL AND TRANSVAGINAL ULTRASOUND OF PELVIS Technique:  Both transabdominal and transvaginal ultrasound examinations of the pelvis were performed. Transabdominal technique was performed for global imaging of the pelvis including uterus, ovaries, adnexal regions, and pelvic cul-de-sac.  It was necessary to proceed with endovaginal exam following the transabdominal exam to visualize the ovaries in greater detail.  Comparison:  None  Findings:  Uterus: Normal in size and appearance; measures 8.0 x 3.2 x 4.0 cm.  Endometrium: Normal in thickness and appearance; measures 1.0 cm in thickness.  Right ovary:  Normal appearance/no adnexal mass; measures 3.3 x 2.5 x 2.1 cm.  Left ovary: Normal appearance/no adnexal mass; measures 3.6 x 2.5 x 1.8 cm.  Other findings: No free fluid is seen within the pelvic cul-de-sac.  Note is made of prominence of the periuterine vasculature; given apparent reflux of contrast along the ovarian veins on CT, this could reflect pelvic congestion syndrome in the appropriate clinical situation.  IMPRESSION:  1.  No abnormality seen to correspond to the patient's passage of clots; the uterus is unremarkable in appearance. 2.  Prominent periuterine  vasculature noted; given apparent reflux of contrast along the ovarian veins on recent CT, this could reflect pelvic congestion  syndrome in the appropriate clinical situation.  Ovaries unremarkable in appearance.   Original Report Authenticated By: Tonia Ghent, M.D.    US Pelvis Complete  07/22/2012  *RADIOLOGY REPORT*  Clinical Data: Passing vaginal clots; patient needs heparin for portal vein thrombosis.  TRANSABDOMINAL AND TRANSVAGINAL ULTRASOUND OF PELVIS Technique:  Both transabdominal and transvaginal ultrasound examinations of the pelvis were performed. Transabdominal technique was performed for global imaging of the pelvis including uterus, ovaries, adnexal regions, and pelvic cul-de-sac.  It was necessary to proceed with endovaginal exam following the transabdominal exam to visualize the ovaries in greater detail.  Comparison:  None  Findings:  Uterus: Normal in size and appearance; measures 8.0 x 3.2 x 4.0 cm.  Endometrium: Normal in thickness and appearance; measures 1.0 cm in thickness.  Right ovary:  Normal appearance/no adnexal mass; measures 3.3 x 2.5 x 2.1 cm.  Left ovary: Normal appearance/no adnexal mass; measures 3.6 x 2.5 x 1.8 cm.  Other findings: No free fluid is seen within the pelvic cul-de-sac.  Note is made of prominence of the periuterine vasculature; given apparent reflux of contrast along the ovarian veins on CT, this could reflect pelvic congestion syndrome in the appropriate clinical situation.  IMPRESSION:  1.  No abnormality seen to correspond to the patient's passage of clots; the uterus is unremarkable in appearance. 2.  Prominent periuterine vasculature noted; given apparent reflux of contrast along the ovarian veins on recent CT, this could reflect pelvic congestion syndrome in the appropriate clinical situation.  Ovaries unremarkable in appearance.   Original Report Authenticated By: Tonia Ghent, M.D.    Ct Abdomen Pelvis W Contrast  07/22/2012  *RADIOLOGY REPORT*  Clinical Data: Left lower quadrant pain, status post diagnostic laparoscopy for ovarian cysts, prior cholecystectomy  CT ABDOMEN  AND PELVIS WITH CONTRAST  Technique:  Multidetector CT imaging of the abdomen and pelvis was performed following the standard protocol during bolus administration of intravenous contrast.  Contrast: OMNIPAQUE IOHEXOL 300 MG/ML  SOLN  Comparison: 10/10/2010  Findings: Lung bases are clear.  Filling defect within branches of the anterior right portal vein leading to the right hepatic dome (series 2/image 20).  Heterogeneous appearance of the SMV could reflect mixing artifact (series 2/image 34), however the unopacified jejunal branches within the left lower quadrant are also mildly increased in caliber (series 2/image 39).  This raises concern for possible additional thrombus.  Liver, spleen, pancreas, and adrenal glands are otherwise within normal limits.  Status post cholecystectomy.  No intrahepatic or extrahepatic ductal dilatation.  6 mm left renal cyst (series 2/image 30).  No hydronephrosis.  No evidence of bowel obstruction.  No associated changes in the bowel to suggest ischemia.  Normal appendix.  No evidence of abdominal aortic aneurysm.  Suspicious abdominopelvic lymphadenopathy.  Trace pelvic ascites, likely physiologic.  Uterus and bilateral ovaries are unremarkable.  Bladder is within normal limits.  Mild postsurgical changes in the anterior abdominal wall.  Visualized osseous structures are within normal limits.  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  Possible thrombus within jejunal mesenteric branches in the left lower quadrant (versus mixing artifact).  No associated changes in the bowel to suggest ischemia.  These results will be called to the ordering clinician or representative by the Radiologist Assistant, and communication documented in the PACS Dashboard.   Original Report Authenticated By: Charline Bills,  M.D.     Medications:  Scheduled:  Continuous: . heparin 800 Units/hr (07/22/12 2227)    Assessment/Plan: 1) Duodenal ulcer  - nonbleeding. 2) Anemia secondary to the ulcer.   She is stable.  HGB is stable.  No further GI intervention required at this time.  Plan: 1) PPI indefinitely. 2) Follow HGB and transfuse as necessary. 3) Signing off.   LOS: 2 days   HUNG,PATRICK D 07/23/2012, 3:24 PM

## 2012-07-23 NOTE — Progress Notes (Deleted)
Subjective: No acute events.  No complaints.  Objective: Vital signs in last 24 hours: Temp:  [98.1 F (36.7 C)-98.2 F (36.8 C)] 98.1 F (36.7 C) (02/11 1408) Pulse Rate:  [73-84] 84 (02/11 1408) Resp:  [16-18] 18 (02/11 1408) BP: (93-106)/(56-62) 93/62 mmHg (02/11 1408) SpO2:  [100 %] 100 % (02/11 1408) Last BM Date: 07/19/12  Intake/Output from previous day: 02/10 0701 - 02/11 0700 In: 68.4 [I.V.:68.4] Out: 850 [Urine:850] Intake/Output this shift:    PE: Gen:  Sleeping, but arousable. GI: Soft, NTND, +BS  Lab Results:  Recent Labs  07/21/12 2217 07/23/12 0625  WBC 6.7 4.6  HGB 12.6 11.8*  HCT 36.8 35.9*  PLT 144* 151   BMET  Recent Labs  07/21/12 2217  NA 140  K 3.7  CL 102  CO2 28  GLUCOSE 72  BUN 13  CREATININE 0.65  CALCIUM 9.1   LFT  Recent Labs  07/21/12 2217  PROT 6.8  ALBUMIN 3.7  AST 15  ALT 8  ALKPHOS 30*  BILITOT 0.5   PT/INR  Recent Labs  07/23/12 1713  LABPROT 13.4  INR 1.03   Hepatitis Panel No results found for this basename: HEPBSAG, HCVAB, HEPAIGM, HEPBIGM,  in the last 72 hours C-Diff No results found for this basename: CDIFFTOX,  in the last 72 hours Fecal Lactopherrin No results found for this basename: FECLLACTOFRN,  in the last 72 hours  Studies/Results: Mr Brain Wo Contrast  07/22/2012  *RADIOLOGY REPORT*  Clinical Data:  37 year old female with unexplained sudden onset bilateral lower extremity weakness and blurred vision following exploratory laparotomy on 07/19/2012 for ovarian cysts.  Comparison:   None.  MRI HEAD WITHOUT CONTRAST  Technique:  Multiplanar, multiecho pulse sequences of the brain and surrounding structures were obtained without intravenous contrast.  Findings:  Normal cerebral volume. No restricted diffusion to suggest acute infarction.  No midline shift, mass effect, evidence of mass lesion, ventriculomegaly, extra-axial collection or acute intracranial hemorrhage.  Cervicomedullary junction  and pituitary are within normal limits.  Major intracranial vascular flow voids are preserved. Wallace Cullens and white matter signal is within normal limits throughout the brain.  Cervical spine findings are described below.  Visualized orbit soft tissues are within normal limits.  Visualized paranasal sinuses and mastoids are clear.  Negative scalp soft tissues. Visualized bone marrow signal is within normal limits.  IMPRESSION:   1. Normal noncontrast MRI appearance of the brain. 2.  Cervical and thoracic spine findings are below.  MRI CERVICAL SPINE WITHOUT CONTRAST  Technique:  Multiplanar and multiecho pulse sequences of the cervic al spine, to include the craniocervical junction and cervicothoraci c junction, were obtained according to standard protocol without intravenous contrast.  Findings:  Cervicomedullary junction is within normal limits. Normal cervical spinal cord signal and morphology.  Normal cervical vertebral height and alignment. No marrow edema or evidence of acute osseous abnormality.  Visualized paraspinal soft tissues are within normal limits.  No significant cervical spine degenerative changes.  No spinal stenosis or neural impingement.  IMPRESSION: 1.  Normal noncontrast MRI appearance of the cervical spine. 2.  Thoracic spine findings are below.  MRI THORACIC SPINE WITHOUT CONTRAST  Technique: Multiplanar and multiecho pulse sequences of the thoracic spine were obtained without intravenous contrast.  Findings:  Normal thoracic vertebral height and alignment. No marrow edema or evidence of acute osseous abnormality.  Normal bone marrow signal. No marrow edema or evidence of acute osseous abnormality.  Visualized paraspinal soft tissues are within normal limits.  Trace layering pleural effusions.  Otherwise negative visualized thoracic viscera negative visualized upper abdominal viscera.  Normal thoracic spinal cord signal and morphology.  The conus medullaris occurs below the L1 level and is only  partially visualized.  Normal thoracic spinal canal patency.  No thoracic spine disc degeneration.  No spinal or foraminal stenosis.  IMPRESSION: 1.  Normal noncontrast MRI appearance of the thoracic spine. 2.  Trace layering pleural effusions.   Original Report Authenticated By: Erskine Speed, M.D.    Mr Cervical Spine Wo Contrast  07/22/2012  *RADIOLOGY REPORT*  Clinical Data:  37 year old female with unexplained sudden onset bilateral lower extremity weakness and blurred vision following exploratory laparotomy on 07/19/2012 for ovarian cysts.  Comparison:   None.  MRI HEAD WITHOUT CONTRAST  Technique:  Multiplanar, multiecho pulse sequences of the brain and surrounding structures were obtained without intravenous contrast.  Findings:  Normal cerebral volume. No restricted diffusion to suggest acute infarction.  No midline shift, mass effect, evidence of mass lesion, ventriculomegaly, extra-axial collection or acute intracranial hemorrhage.  Cervicomedullary junction and pituitary are within normal limits.  Major intracranial vascular flow voids are preserved. Wallace Cullens and white matter signal is within normal limits throughout the brain.  Cervical spine findings are described below.  Visualized orbit soft tissues are within normal limits.  Visualized paranasal sinuses and mastoids are clear.  Negative scalp soft tissues. Visualized bone marrow signal is within normal limits.  IMPRESSION:   1. Normal noncontrast MRI appearance of the brain. 2.  Cervical and thoracic spine findings are below.  MRI CERVICAL SPINE WITHOUT CONTRAST  Technique:  Multiplanar and multiecho pulse sequences of the cervic al spine, to include the craniocervical junction and cervicothoraci c junction, were obtained according to standard protocol without intravenous contrast.  Findings:  Cervicomedullary junction is within normal limits. Normal cervical spinal cord signal and morphology.  Normal cervical vertebral height and alignment. No marrow  edema or evidence of acute osseous abnormality.  Visualized paraspinal soft tissues are within normal limits.  No significant cervical spine degenerative changes.  No spinal stenosis or neural impingement.  IMPRESSION: 1.  Normal noncontrast MRI appearance of the cervical spine. 2.  Thoracic spine findings are below.  MRI THORACIC SPINE WITHOUT CONTRAST  Technique: Multiplanar and multiecho pulse sequences of the thoracic spine were obtained without intravenous contrast.  Findings:  Normal thoracic vertebral height and alignment. No marrow edema or evidence of acute osseous abnormality.  Normal bone marrow signal. No marrow edema or evidence of acute osseous abnormality.  Visualized paraspinal soft tissues are within normal limits.  Trace layering pleural effusions.  Otherwise negative visualized thoracic viscera negative visualized upper abdominal viscera.  Normal thoracic spinal cord signal and morphology.  The conus medullaris occurs below the L1 level and is only partially visualized.  Normal thoracic spinal canal patency.  No thoracic spine disc degeneration.  No spinal or foraminal stenosis.  IMPRESSION: 1.  Normal noncontrast MRI appearance of the thoracic spine. 2.  Trace layering pleural effusions.   Original Report Authenticated By: Erskine Speed, M.D.    Mr Thoracic Spine Wo Contrast  07/22/2012  *RADIOLOGY REPORT*  Clinical Data:  37 year old female with unexplained sudden onset bilateral lower extremity weakness and blurred vision following exploratory laparotomy on 07/19/2012 for ovarian cysts.  Comparison:   None.  MRI HEAD WITHOUT CONTRAST  Technique:  Multiplanar, multiecho pulse sequences of the brain and surrounding structures were obtained without intravenous contrast.  Findings:  Normal cerebral volume. No restricted  diffusion to suggest acute infarction.  No midline shift, mass effect, evidence of mass lesion, ventriculomegaly, extra-axial collection or acute intracranial hemorrhage.   Cervicomedullary junction and pituitary are within normal limits.  Major intracranial vascular flow voids are preserved. Wallace Cullens and white matter signal is within normal limits throughout the brain.  Cervical spine findings are described below.  Visualized orbit soft tissues are within normal limits.  Visualized paranasal sinuses and mastoids are clear.  Negative scalp soft tissues. Visualized bone marrow signal is within normal limits.  IMPRESSION:   1. Normal noncontrast MRI appearance of the brain. 2.  Cervical and thoracic spine findings are below.  MRI CERVICAL SPINE WITHOUT CONTRAST  Technique:  Multiplanar and multiecho pulse sequences of the cervic al spine, to include the craniocervical junction and cervicothoraci c junction, were obtained according to standard protocol without intravenous contrast.  Findings:  Cervicomedullary junction is within normal limits. Normal cervical spinal cord signal and morphology.  Normal cervical vertebral height and alignment. No marrow edema or evidence of acute osseous abnormality.  Visualized paraspinal soft tissues are within normal limits.  No significant cervical spine degenerative changes.  No spinal stenosis or neural impingement.  IMPRESSION: 1.  Normal noncontrast MRI appearance of the cervical spine. 2.  Thoracic spine findings are below.  MRI THORACIC SPINE WITHOUT CONTRAST  Technique: Multiplanar and multiecho pulse sequences of the thoracic spine were obtained without intravenous contrast.  Findings:  Normal thoracic vertebral height and alignment. No marrow edema or evidence of acute osseous abnormality.  Normal bone marrow signal. No marrow edema or evidence of acute osseous abnormality.  Visualized paraspinal soft tissues are within normal limits.  Trace layering pleural effusions.  Otherwise negative visualized thoracic viscera negative visualized upper abdominal viscera.  Normal thoracic spinal cord signal and morphology.  The conus medullaris occurs below  the L1 level and is only partially visualized.  Normal thoracic spinal canal patency.  No thoracic spine disc degeneration.  No spinal or foraminal stenosis.  IMPRESSION: 1.  Normal noncontrast MRI appearance of the thoracic spine. 2.  Trace layering pleural effusions.   Original Report Authenticated By: Erskine Speed, M.D.    US Transvaginal Non-ob  07/22/2012  *RADIOLOGY REPORT*  Clinical Data: Passing vaginal clots; patient needs heparin for portal vein thrombosis.  TRANSABDOMINAL AND TRANSVAGINAL ULTRASOUND OF PELVIS Technique:  Both transabdominal and transvaginal ultrasound examinations of the pelvis were performed. Transabdominal technique was performed for global imaging of the pelvis including uterus, ovaries, adnexal regions, and pelvic cul-de-sac.  It was necessary to proceed with endovaginal exam following the transabdominal exam to visualize the ovaries in greater detail.  Comparison:  None  Findings:  Uterus: Normal in size and appearance; measures 8.0 x 3.2 x 4.0 cm.  Endometrium: Normal in thickness and appearance; measures 1.0 cm in thickness.  Right ovary:  Normal appearance/no adnexal mass; measures 3.3 x 2.5 x 2.1 cm.  Left ovary: Normal appearance/no adnexal mass; measures 3.6 x 2.5 x 1.8 cm.  Other findings: No free fluid is seen within the pelvic cul-de-sac.  Note is made of prominence of the periuterine vasculature; given apparent reflux of contrast along the ovarian veins on CT, this could reflect pelvic congestion syndrome in the appropriate clinical situation.  IMPRESSION:  1.  No abnormality seen to correspond to the patient's passage of clots; the uterus is unremarkable in appearance. 2.  Prominent periuterine vasculature noted; given apparent reflux of contrast along the ovarian veins on recent CT, this could reflect pelvic  congestion syndrome in the appropriate clinical situation.  Ovaries unremarkable in appearance.   Original Report Authenticated By: Tonia Ghent, M.D.    US  Pelvis Complete  07/22/2012  *RADIOLOGY REPORT*  Clinical Data: Passing vaginal clots; patient needs heparin for portal vein thrombosis.  TRANSABDOMINAL AND TRANSVAGINAL ULTRASOUND OF PELVIS Technique:  Both transabdominal and transvaginal ultrasound examinations of the pelvis were performed. Transabdominal technique was performed for global imaging of the pelvis including uterus, ovaries, adnexal regions, and pelvic cul-de-sac.  It was necessary to proceed with endovaginal exam following the transabdominal exam to visualize the ovaries in greater detail.  Comparison:  None  Findings:  Uterus: Normal in size and appearance; measures 8.0 x 3.2 x 4.0 cm.  Endometrium: Normal in thickness and appearance; measures 1.0 cm in thickness.  Right ovary:  Normal appearance/no adnexal mass; measures 3.3 x 2.5 x 2.1 cm.  Left ovary: Normal appearance/no adnexal mass; measures 3.6 x 2.5 x 1.8 cm.  Other findings: No free fluid is seen within the pelvic cul-de-sac.  Note is made of prominence of the periuterine vasculature; given apparent reflux of contrast along the ovarian veins on CT, this could reflect pelvic congestion syndrome in the appropriate clinical situation.  IMPRESSION:  1.  No abnormality seen to correspond to the patient's passage of clots; the uterus is unremarkable in appearance. 2.  Prominent periuterine vasculature noted; given apparent reflux of contrast along the ovarian veins on recent CT, this could reflect pelvic congestion syndrome in the appropriate clinical situation.  Ovaries unremarkable in appearance.   Original Report Authenticated By: Tonia Ghent, M.D.    Ct Abdomen Pelvis W Contrast  07/22/2012  *RADIOLOGY REPORT*  Clinical Data: Left lower quadrant pain, status post diagnostic laparoscopy for ovarian cysts, prior cholecystectomy  CT ABDOMEN AND PELVIS WITH CONTRAST  Technique:  Multidetector CT imaging of the abdomen and pelvis was performed following the standard protocol during bolus  administration of intravenous contrast.  Contrast: OMNIPAQUE IOHEXOL 300 MG/ML  SOLN  Comparison: 10/10/2010  Findings: Lung bases are clear.  Filling defect within branches of the anterior right portal vein leading to the right hepatic dome (series 2/image 20).  Heterogeneous appearance of the SMV could reflect mixing artifact (series 2/image 34), however the unopacified jejunal branches within the left lower quadrant are also mildly increased in caliber (series 2/image 39).  This raises concern for possible additional thrombus.  Liver, spleen, pancreas, and adrenal glands are otherwise within normal limits.  Status post cholecystectomy.  No intrahepatic or extrahepatic ductal dilatation.  6 mm left renal cyst (series 2/image 30).  No hydronephrosis.  No evidence of bowel obstruction.  No associated changes in the bowel to suggest ischemia.  Normal appendix.  No evidence of abdominal aortic aneurysm.  Suspicious abdominopelvic lymphadenopathy.  Trace pelvic ascites, likely physiologic.  Uterus and bilateral ovaries are unremarkable.  Bladder is within normal limits.  Mild postsurgical changes in the anterior abdominal wall.  Visualized osseous structures are within normal limits.  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  Possible thrombus within jejunal mesenteric branches in the left lower quadrant (versus mixing artifact).  No associated changes in the bowel to suggest ischemia.  These results will be called to the ordering clinician or representative by the Radiologist Assistant, and communication documented in the PACS Dashboard.   Original Report Authenticated By: Charline Bills, M.D.     Assessment/Plan: 1) Duodenal ulcer. 2) Anemia secondary to the ulcer.   The  patient is clinically stable and her HGB is stable.  She will need to be on a PPI indefinitely.  No further GI intervention required at this time.  Plan: 1) Continue with PPI. 2) Follow  HGB and transfuse as necessary. 3) No further GI intervention at this time.  Call with any questions.  Signing off.  LOS: 2 days   HUNG,PATRICK D 07/23/2012, 8:24 PM

## 2012-07-24 ENCOUNTER — Inpatient Hospital Stay (HOSPITAL_COMMUNITY): Payer: BC Managed Care – PPO

## 2012-07-24 LAB — CBC
HCT: 38 % (ref 36.0–46.0)
Hemoglobin: 12.5 g/dL (ref 12.0–15.0)
MCH: 29.3 pg (ref 26.0–34.0)
MCHC: 32.9 g/dL (ref 30.0–36.0)
MCV: 89.2 fL (ref 78.0–100.0)
Platelets: 159 10*3/uL (ref 150–400)
RBC: 4.26 MIL/uL (ref 3.87–5.11)
RDW: 12.3 % (ref 11.5–15.5)
WBC: 4.4 10*3/uL (ref 4.0–10.5)

## 2012-07-24 LAB — HEPARIN LEVEL (UNFRACTIONATED): Heparin Unfractionated: 0.58 IU/mL (ref 0.30–0.70)

## 2012-07-24 LAB — PROTIME-INR
INR: 1.13 (ref 0.00–1.49)
Prothrombin Time: 14.3 seconds (ref 11.6–15.2)

## 2012-07-24 MED ORDER — WARFARIN SODIUM 7.5 MG PO TABS
7.5000 mg | ORAL_TABLET | Freq: Once | ORAL | Status: AC
  Administered 2012-07-24: 7.5 mg via ORAL
  Filled 2012-07-24: qty 1

## 2012-07-24 MED ORDER — ENOXAPARIN SODIUM 60 MG/0.6ML ~~LOC~~ SOLN
1.0000 mg/kg | Freq: Two times a day (BID) | SUBCUTANEOUS | Status: DC
  Filled 2012-07-24 (×2): qty 0.6

## 2012-07-24 MED ORDER — ENOXAPARIN SODIUM 60 MG/0.6ML ~~LOC~~ SOLN
1.0000 mg/kg | Freq: Two times a day (BID) | SUBCUTANEOUS | Status: DC
  Administered 2012-07-24 – 2012-07-25 (×3): 50 mg via SUBCUTANEOUS
  Filled 2012-07-24 (×5): qty 0.6

## 2012-07-24 MED ORDER — ENOXAPARIN (LOVENOX) PATIENT EDUCATION KIT
PACK | Freq: Once | Status: AC
  Administered 2012-07-24: 14:00:00
  Filled 2012-07-24: qty 1

## 2012-07-24 NOTE — Progress Notes (Signed)
[facility] PROGRESS NOTE  Carola Viramontes ZOX:[id number] DOB: Dec 07, 1975 DOA: 07/21/2012 PCP: Provider Not In System  Assessment/Plan: Portal vein thrombosis  as seen on CT abdomen and pelvis. No clear etiology. Patient denies heavy etoh use. LFTs wnl. No hx suggestive of malignancy. Does informs both her father and sister having blood clots. CT also comments on abdominopelvic lymph nodes which will warrant follow up CT in future. Started on heparin drip . GI consulted who  recommended getting heme evaluating for long term anticoagulation.  hypercoagulable w/up pending. i discussed the patient with Dr Cyndie Chime who recommended her to be started on coumadin. i have called the coumadin clinic to set up an appointment for her as Dr Cyndie Chime will not be able to see her within next 1-2 weeks. Patient will be called from coumadin clinic for follow up within  Week. - Dr Cyndie Chime also recommends getting doppler studies of the portal vein to better visualize the acuity of thrombus.  - could possibly be discharged on coumadin following doppler studies and hypercoagulable results if coumadin clinic appointment within the next day or two could be established. - normal renal function, will transition to Lovenox 1 mg/kg BID and Coumadin. Coumadin clinic has been contacted yesterday. Will talk to pharmacy re: Coumadin dosing.   abdominal pain - had RLQ pain which does not correlate with the portal vein thrombus. currently stable. recent laparoscopy and imaging negative for any localized lesion except for findings noted above.  Vagina bleed - noted on 2/10. Appears in the setting o menstruation.  B/l lower extremity weakness - resolved. MRI on the brain and spine unremarkable. No clear etiology. Neuro signed off.  Code Status: full Family Communication: husband at bedside Disposition Plan: home today or tomorrow, needs doppler studies and Lovenox teaching.   Consultants: Neurology Dr Elnoria Howard (  GI)  d/w Dr Cyndie Chime over the phone. Please call if any questions  Procedures:  none  Antibiotics:  none  HPI/Subjective: LE weakness resolved. Had vaginal blled with passage of clots which she attributes to menstrual bleed.   Objective: Filed Vitals:   07/23/12 0500 07/23/12 1408 07/23/12 2154 07/24/12 0633  BP: 106/62 93/62 102/65 96/62  Pulse: 74 84 80 72  Temp: 98.1 F (36.7 C) 98.1 F (36.7 C) 97.8 F (36.6 C) 98 F (36.7 C)  TempSrc:  Oral Oral Oral  Resp: 18 18 16 17   Height:      Weight:      SpO2: 100% 100% 98% 99%    Intake/Output Summary (Last 24 hours) at 07/24/12 1225 Last data filed at 07/24/12 0700  Gross per 24 hour  Intake    792 ml  Output      0 ml  Net    792 ml   Filed Weights   07/22/12 0123  Weight: 50.803 kg (112 lb)    Exam:   General: middle aged female in NAD  HEENT: no palor, moist oral mucosa  Cardiovascular: NS1&S2, no murmurs  Respiratory: clear b/l, no added sounds  Abdomen: soft, NT, ND BS+  Ext: warm, no edema  CNS: AAOX3  Data Reviewed: Basic Metabolic Panel:  Recent Labs Lab 07/21/12 2217  NA 140  K 3.7  CL 102  CO2 28  GLUCOSE 72  BUN 13  CREATININE 0.65  CALCIUM 9.1   Liver Function Tests:  Recent Labs Lab 07/21/12 2217  AST 15  ALT 8  ALKPHOS 30*  BILITOT 0.5  PROT 6.8  ALBUMIN 3.7   CBC:  Recent Labs Lab 07/21/12 2217 07/23/12 0625 07/24/12 0432  WBC 6.7 4.6 4.4  NEUTROABS 4.4  --   --   HGB 12.6 11.8* 12.5  HCT 36.8 35.9* 38.0  MCV 87.0 88.4 89.2  PLT 144* 151 159   Studies: US Transvaginal Non-ob  08-Aug-2012  *RADIOLOGY REPORT*  Clinical Data: Passing vaginal clots; patient needs heparin for portal vein thrombosis.  TRANSABDOMINAL AND TRANSVAGINAL ULTRASOUND OF PELVIS Technique:  Both transabdominal and transvaginal ultrasound examinations of the pelvis were performed. Transabdominal technique was performed for global imaging of the pelvis including uterus, ovaries,  adnexal regions, and pelvic cul-de-sac.  It was necessary to proceed with endovaginal exam following the transabdominal exam to visualize the ovaries in greater detail.  Comparison:  None  Findings:  Uterus: Normal in size and appearance; measures 8.0 x 3.2 x 4.0 cm.  Endometrium: Normal in thickness and appearance; measures 1.0 cm in thickness.  Right ovary:  Normal appearance/no adnexal mass; measures 3.3 x 2.5 x 2.1 cm.  Left ovary: Normal appearance/no adnexal mass; measures 3.6 x 2.5 x 1.8 cm.  Other findings: No free fluid is seen within the pelvic cul-de-sac.  Note is made of prominence of the periuterine vasculature; given apparent reflux of contrast along the ovarian veins on CT, this could reflect pelvic congestion syndrome in the appropriate clinical situation.  IMPRESSION:  1.  No abnormality seen to correspond to the patient's passage of clots; the uterus is unremarkable in appearance. 2.  Prominent periuterine vasculature noted; given apparent reflux of contrast along the ovarian veins on recent CT, this could reflect pelvic congestion syndrome in the appropriate clinical situation.  Ovaries unremarkable in appearance.   Original Report Authenticated By: Tonia Ghent, M.D.    US Pelvis Complete  2012-08-08  *RADIOLOGY REPORT*  Clinical Data: Passing vaginal clots; patient needs heparin for portal vein thrombosis.  TRANSABDOMINAL AND TRANSVAGINAL ULTRASOUND OF PELVIS Technique:  Both transabdominal and transvaginal ultrasound examinations of the pelvis were performed. Transabdominal technique was performed for global imaging of the pelvis including uterus, ovaries, adnexal regions, and pelvic cul-de-sac.  It was necessary to proceed with endovaginal exam following the transabdominal exam to visualize the ovaries in greater detail.  Comparison:  None  Findings:  Uterus: Normal in size and appearance; measures 8.0 x 3.2 x 4.0 cm.  Endometrium: Normal in thickness and appearance; measures 1.0 cm in  thickness.  Right ovary:  Normal appearance/no adnexal mass; measures 3.3 x 2.5 x 2.1 cm.  Left ovary: Normal appearance/no adnexal mass; measures 3.6 x 2.5 x 1.8 cm.  Other findings: No free fluid is seen within the pelvic cul-de-sac.  Note is made of prominence of the periuterine vasculature; given apparent reflux of contrast along the ovarian veins on CT, this could reflect pelvic congestion syndrome in the appropriate clinical situation.  IMPRESSION:  1.  No abnormality seen to correspond to the patient's passage of clots; the uterus is unremarkable in appearance. 2.  Prominent periuterine vasculature noted; given apparent reflux of contrast along the ovarian veins on recent CT, this could reflect pelvic congestion syndrome in the appropriate clinical situation.  Ovaries unremarkable in appearance.   Original Report Authenticated By: Tonia Ghent, M.D.    Ct Abdomen Pelvis W Contrast  08-08-2012  *RADIOLOGY REPORT*  Clinical Data: Left lower quadrant pain, status post diagnostic laparoscopy for ovarian cysts, prior cholecystectomy  CT ABDOMEN AND PELVIS WITH CONTRAST  Technique:  Multidetector CT imaging of the abdomen and pelvis was performed following the  standard protocol during bolus administration of intravenous contrast.  Contrast: OMNIPAQUE IOHEXOL 300 MG/ML  SOLN  Comparison: 10/10/2010  Findings: Lung bases are clear.  Filling defect within branches of the anterior right portal vein leading to the right hepatic dome (series 2/image 20).  Heterogeneous appearance of the SMV could reflect mixing artifact (series 2/image 34), however the unopacified jejunal branches within the left lower quadrant are also mildly increased in caliber (series 2/image 39).  This raises concern for possible additional thrombus.  Liver, spleen, pancreas, and adrenal glands are otherwise within normal limits.  Status post cholecystectomy.  No intrahepatic or extrahepatic ductal dilatation.  6 mm left renal cyst (series  2/image 30).  No hydronephrosis.  No evidence of bowel obstruction.  No associated changes in the bowel to suggest ischemia.  Normal appendix.  No evidence of abdominal aortic aneurysm.  Suspicious abdominopelvic lymphadenopathy.  Trace pelvic ascites, likely physiologic.  Uterus and bilateral ovaries are unremarkable.  Bladder is within normal limits.  Mild postsurgical changes in the anterior abdominal wall.  Visualized osseous structures are within normal limits.  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  Possible thrombus within jejunal mesenteric branches in the left lower quadrant (versus mixing artifact).  No associated changes in the bowel to suggest ischemia.  These results will be called to the ordering clinician or representative by the Radiologist Assistant, and communication documented in the PACS Dashboard.   Original Report Authenticated By: Charline Bills, M.D.     Scheduled Meds: . coumadin book   Does not apply Once  . enoxaparin (LOVENOX) injection  1 mg/kg Subcutaneous Q12H  . warfarin  7.5 mg Oral ONCE-1800  . Warfarin - Pharmacist Dosing Inpatient   Does not apply q1800   Continuous Infusions:    Principal Problem:   Lower extremity weakness Active Problems:   RLQ abdominal pain   Portal vein thrombosis  Pamella Pert  [facility] Pager [phone] If 7PM-7AM, please contact night-coverage at [email or web address], password [facility] 07/24/2012, 12:25 PM  LOS: 3 days

## 2012-07-24 NOTE — Progress Notes (Signed)
ANTICOAGULATION CONSULT NOTE - Follow Up Consult  Pharmacy Consult for Heparin/Coumadin Indication: Portal vein thrombosis  No Known Allergies  Patient Measurements: Height: 5\' 2"  (157.5 cm) Weight: 112 lb (50.803 kg) IBW/kg (Calculated) : 50.1 Heparin Dosing Weight: 50.8 kg  Vital Signs: Temp: 98 F (36.7 C) (02/12 0633) Temp src: Oral (02/12 0633) BP: 96/62 mmHg (02/12 0633) Pulse Rate: 72 (02/12 0633)  Labs:  Recent Labs  07/21/12 2217 07/23/12 0625 07/23/12 1325 07/23/12 1713 07/24/12 0432  HGB 12.6 11.8*  --   --  12.5  HCT 36.8 35.9*  --   --  38.0  PLT 144* 151  --   --  159  LABPROT  --   --   --  13.4 14.3  INR  --   --   --  1.03 1.13  HEPARINUNFRC  --  0.32 0.38  --  0.58  CREATININE 0.65  --   --   --   --     Estimated Creatinine Clearance: 77.6 ml/min (by C-G formula based on Cr of 0.65).  Assessment: 75 YOF presented with LE weakness and blurry vision after ex-lap on 2/7. MRI of brain/spine negative. Abdominal CT positive for a portal vein thrombosis and started on IV heparin and Coumadin overlap day #2. Noted documented menstruation on 2/10 progress notes.   Heparin level is therapeutic at 0.58. INR is sub-therapeutic at 1.13 (increase after 1 dose of 7.5mg )  CBC is stable. No bleeding reported.   Goal of Therapy:  Heparin level 0.3-0.7 units/ml Monitor platelets by anticoagulation protocol: Yes   Plan:  1. Continue Heparin at 800 units/hr.  2. Coumadin 7.5mg  po x1 tonight. 3. Follow-up daily heparin level, CBC, and INR.   Link Snuffer, PharmD, BCPS Clinical Pharmacist [phone] 07/24/2012,8:21 AM

## 2012-07-25 ENCOUNTER — Telehealth: Payer: Self-pay | Admitting: Oncology

## 2012-07-25 ENCOUNTER — Other Ambulatory Visit: Payer: Self-pay | Admitting: Pharmacist

## 2012-07-25 ENCOUNTER — Encounter: Payer: Self-pay | Admitting: Pharmacist

## 2012-07-25 DIAGNOSIS — R5381 Other malaise: Secondary | ICD-10-CM

## 2012-07-25 DIAGNOSIS — R5383 Other fatigue: Secondary | ICD-10-CM

## 2012-07-25 DIAGNOSIS — I81 Portal vein thrombosis: Secondary | ICD-10-CM

## 2012-07-25 LAB — CBC
HCT: 36.6 % (ref 36.0–46.0)
Hemoglobin: 12.1 g/dL (ref 12.0–15.0)
MCH: 29.5 pg (ref 26.0–34.0)
MCHC: 33.1 g/dL (ref 30.0–36.0)
MCV: 89.3 fL (ref 78.0–100.0)
Platelets: 151 10*3/uL (ref 150–400)
RBC: 4.1 MIL/uL (ref 3.87–5.11)
RDW: 12.4 % (ref 11.5–15.5)
WBC: 4.4 10*3/uL (ref 4.0–10.5)

## 2012-07-25 LAB — PROTIME-INR
INR: 1.54 — ABNORMAL HIGH (ref 0.00–1.49)
Prothrombin Time: 18 seconds — ABNORMAL HIGH (ref 11.6–15.2)

## 2012-07-25 MED ORDER — WARFARIN SODIUM 5 MG PO TABS: 5.0000 mg | ORAL_TABLET | Freq: Every day | ORAL | Status: DC

## 2012-07-25 MED ORDER — ENOXAPARIN SODIUM 60 MG/0.6ML ~~LOC~~ SOLN: 1.0000 mg/kg | Freq: Two times a day (BID) | SUBCUTANEOUS | Status: DC

## 2012-07-25 NOTE — Telephone Encounter (Signed)
S/W pt in re hosp f/u appt 03/07 @ 4 w/Dr. Reece Agar Welcome packet mailed.

## 2012-07-25 NOTE — Progress Notes (Signed)
Patient discharged to home with instructions. 

## 2012-07-25 NOTE — Progress Notes (Signed)
07-25-12 Plan to discharge today , follow up PT / INR check 07-26-12 at [facility] Jul 26, 2012 at 1030 . Ronny Flurry RN BSN [phone]

## 2012-07-25 NOTE — Telephone Encounter (Signed)
C/D 07/25/12 for appt. 08/16/12

## 2012-07-25 NOTE — Discharge Summary (Signed)
Physician Discharge Summary  Kathy Wu ZOX:[id number] DOB: 1975/09/02 DOA: 07/21/2012  PCP: Provider Not In System  Admit date: 07/21/2012 Discharge date: 07/25/2012  Time spent: 40 minutes  Recommendations for Outpatient Follow-up:  1. Please follow up in Coumadin clinic as scheduled  2. Please follow up with Dr. Cyndie Chime in Hematology 3. Please follow up with your PCP in 2-3 weeks  Discharge Diagnoses:  Principal Problem:   Lower extremity weakness Active Problems:   RLQ abdominal pain   Portal vein thrombosis  Discharge Condition: stable  Diet recommendation: regular  Filed Weights   07/22/12 0123  Weight: 50.803 kg (112 lb)   History of present illness:  Kathy Wu is a 37 y.o. female who presents with c/o BLE weakness and blurry vision. Symptoms onset after an ex-lap when waking up from sedation on Friday. She describes severe BLE weakness located in her lower legs, and vision more blurry than baseline. In the ED neurology was consulted and saw the patient, they were mostly unimpressed but did want to get an MRI of her C and T spine to rule out neuromyelitis optica but they did not feel that this needed to be done emergently. Because [facility] does not do non-emergent MRIs at night, admitting patient to obs to check MRI in AM.  Hospital Course:   1. Bilateral lower extremity weakness - Patient was admitted with bilateral lower extremity weakness. Neurology was consulted and she underwent multiple imaging including MRI head, MRI cervical spine, MRI thoracic spine which were unremarkable. Her weakness rapidly improved and completely resolved prior to discharge, without a clear etiology.  2. Portal vein thrombosis - She continued to have right lower quadrant abdominal pain and she underwent a CT of abdomen/pelvis which revealed portal vein thrombosis within branches of the right anterior portal vein. She then underwent duplex Doppler to better evaluate hepatic flow. Imaging showed some  degree of thrombus within right portal vein intrahepatic branches. She does have family history of deep vein thrombosis. Hematology was consulted and patient was started on LMWH to be bridged to Coumadin. She underwent hypercoagulable workup while inpatient.  3. Vaginal bleeding - noted on 2/10, likely in the setting of menstruation. She underwent the above mentioned CT as well as US transvaginal non-ob and a complete pelvic US (full report below).   Procedures:  none   Consultations:  Hematology  Discharge Exam: Filed Vitals:   07/24/12 8119 07/24/12 1414 07/24/12 2157 07/25/12 0557  BP: 96/62 94/64 95/66  100/66  Pulse: 72 94 94 70  Temp: 98 F (36.7 C) 98.3 F (36.8 C) 98.1 F (36.7 C) 97.7 F (36.5 C)  TempSrc: Oral Oral Oral Oral  Resp: 17 16 16 16   Height:      Weight:      SpO2: 99% 100% 100% 100%   General: middle aged female in NAD  HEENT: no palor, moist oral mucosa  Cardiovascular: NS1&S2, no murmurs  Respiratory: clear b/l, no added sounds  Abdomen: soft, NT, ND BS+  Ext: warm, no edema  CNS: AAOX3  Discharge Instructions   Future Appointments Provider Department Dept Phone   08/16/2012 4:00 PM [facility] [phone]   08/16/2012 4:30 PM Levert Feinstein, MD [facility] [phone]       Medication List    TAKE these medications       enoxaparin 60 MG/0.6ML injection  Commonly known as:  LOVENOX  Inject 0.5 mLs (50 mg total)  into the skin every 12 (twelve) hours.     oxyCODONE-acetaminophen 5-325 MG per tablet  Commonly known as:  ROXICET  Take 1 tablet by mouth every 4 (four) hours as needed for pain.     warfarin 5 MG tablet  Commonly known as:  COUMADIN  Take 1 tablet (5 mg total) by mouth daily.        The results of significant diagnostics from this hospitalization (including imaging, microbiology, ancillary and laboratory) are listed below for  reference.    Significant Diagnostic Studies: Mr Brain 53 Contrast  07/22/2012  *RADIOLOGY REPORT*  Clinical Data:  37 year old female with unexplained sudden onset bilateral lower extremity weakness and blurred vision following exploratory laparotomy on 07/19/2012 for ovarian cysts.  Comparison:   None.  MRI HEAD WITHOUT CONTRAST  Technique:  Multiplanar, multiecho pulse sequences of the brain and surrounding structures were obtained without intravenous contrast.  Findings:  Normal cerebral volume. No restricted diffusion to suggest acute infarction.  No midline shift, mass effect, evidence of mass lesion, ventriculomegaly, extra-axial collection or acute intracranial hemorrhage.  Cervicomedullary junction and pituitary are within normal limits.  Major intracranial vascular flow voids are preserved. Wallace Cullens and white matter signal is within normal limits throughout the brain.  Cervical spine findings are described below.  Visualized orbit soft tissues are within normal limits.  Visualized paranasal sinuses and mastoids are clear.  Negative scalp soft tissues. Visualized bone marrow signal is within normal limits.  IMPRESSION:   1. Normal noncontrast MRI appearance of the brain. 2.  Cervical and thoracic spine findings are below.  MRI CERVICAL SPINE WITHOUT CONTRAST  Technique:  Multiplanar and multiecho pulse sequences of the cervic al spine, to include the craniocervical junction and cervicothoraci c junction, were obtained according to standard protocol without intravenous contrast.  Findings:  Cervicomedullary junction is within normal limits. Normal cervical spinal cord signal and morphology.  Normal cervical vertebral height and alignment. No marrow edema or evidence of acute osseous abnormality.  Visualized paraspinal soft tissues are within normal limits.  No significant cervical spine degenerative changes.  No spinal stenosis or neural impingement.  IMPRESSION: 1.  Normal noncontrast MRI appearance of the  cervical spine. 2.  Thoracic spine findings are below.  MRI THORACIC SPINE WITHOUT CONTRAST  Technique: Multiplanar and multiecho pulse sequences of the thoracic spine were obtained without intravenous contrast.  Findings:  Normal thoracic vertebral height and alignment. No marrow edema or evidence of acute osseous abnormality.  Normal bone marrow signal. No marrow edema or evidence of acute osseous abnormality.  Visualized paraspinal soft tissues are within normal limits.  Trace layering pleural effusions.  Otherwise negative visualized thoracic viscera negative visualized upper abdominal viscera.  Normal thoracic spinal cord signal and morphology.  The conus medullaris occurs below the L1 level and is only partially visualized.  Normal thoracic spinal canal patency.  No thoracic spine disc degeneration.  No spinal or foraminal stenosis.  IMPRESSION: 1.  Normal noncontrast MRI appearance of the thoracic spine. 2.  Trace layering pleural effusions.   Original Report Authenticated By: Erskine Speed, M.D.    Mr Cervical Spine Wo Contrast  07/22/2012  *RADIOLOGY REPORT*  Clinical Data:  37 year old female with unexplained sudden onset bilateral lower extremity weakness and blurred vision following exploratory laparotomy on 07/19/2012 for ovarian cysts.  Comparison:   None.  MRI HEAD WITHOUT CONTRAST  Technique:  Multiplanar, multiecho pulse sequences of the brain and surrounding structures were obtained without intravenous contrast.  Findings:  Normal cerebral volume. No restricted diffusion to suggest acute infarction.  No midline shift, mass effect, evidence of mass lesion, ventriculomegaly, extra-axial collection or acute intracranial hemorrhage.  Cervicomedullary junction and pituitary are within normal limits.  Major intracranial vascular flow voids are preserved. Wallace Cullens and white matter signal is within normal limits throughout the brain.  Cervical spine findings are described below.  Visualized orbit soft tissues  are within normal limits.  Visualized paranasal sinuses and mastoids are clear.  Negative scalp soft tissues. Visualized bone marrow signal is within normal limits.  IMPRESSION:   1. Normal noncontrast MRI appearance of the brain. 2.  Cervical and thoracic spine findings are below.  MRI CERVICAL SPINE WITHOUT CONTRAST  Technique:  Multiplanar and multiecho pulse sequences of the cervic al spine, to include the craniocervical junction and cervicothoraci c junction, were obtained according to standard protocol without intravenous contrast.  Findings:  Cervicomedullary junction is within normal limits. Normal cervical spinal cord signal and morphology.  Normal cervical vertebral height and alignment. No marrow edema or evidence of acute osseous abnormality.  Visualized paraspinal soft tissues are within normal limits.  No significant cervical spine degenerative changes.  No spinal stenosis or neural impingement.  IMPRESSION: 1.  Normal noncontrast MRI appearance of the cervical spine. 2.  Thoracic spine findings are below.  MRI THORACIC SPINE WITHOUT CONTRAST  Technique: Multiplanar and multiecho pulse sequences of the thoracic spine were obtained without intravenous contrast.  Findings:  Normal thoracic vertebral height and alignment. No marrow edema or evidence of acute osseous abnormality.  Normal bone marrow signal. No marrow edema or evidence of acute osseous abnormality.  Visualized paraspinal soft tissues are within normal limits.  Trace layering pleural effusions.  Otherwise negative visualized thoracic viscera negative visualized upper abdominal viscera.  Normal thoracic spinal cord signal and morphology.  The conus medullaris occurs below the L1 level and is only partially visualized.  Normal thoracic spinal canal patency.  No thoracic spine disc degeneration.  No spinal or foraminal stenosis.  IMPRESSION: 1.  Normal noncontrast MRI appearance of the thoracic spine. 2.  Trace layering pleural effusions.    Original Report Authenticated By: Erskine Speed, M.D.    Mr Thoracic Spine Wo Contrast  07/22/2012  *RADIOLOGY REPORT*  Clinical Data:  37 year old female with unexplained sudden onset bilateral lower extremity weakness and blurred vision following exploratory laparotomy on 07/19/2012 for ovarian cysts.  Comparison:   None.  MRI HEAD WITHOUT CONTRAST  Technique:  Multiplanar, multiecho pulse sequences of the brain and surrounding structures were obtained without intravenous contrast.  Findings:  Normal cerebral volume. No restricted diffusion to suggest acute infarction.  No midline shift, mass effect, evidence of mass lesion, ventriculomegaly, extra-axial collection or acute intracranial hemorrhage.  Cervicomedullary junction and pituitary are within normal limits.  Major intracranial vascular flow voids are preserved. Wallace Cullens and white matter signal is within normal limits throughout the brain.  Cervical spine findings are described below.  Visualized orbit soft tissues are within normal limits.  Visualized paranasal sinuses and mastoids are clear.  Negative scalp soft tissues. Visualized bone marrow signal is within normal limits.  IMPRESSION:   1. Normal noncontrast MRI appearance of the brain. 2.  Cervical and thoracic spine findings are below.  MRI CERVICAL SPINE WITHOUT CONTRAST  Technique:  Multiplanar and multiecho pulse sequences of the cervic al spine, to include the craniocervical junction and cervicothoraci c junction, were obtained according to standard protocol without intravenous contrast.  Findings:  Cervicomedullary junction is  within normal limits. Normal cervical spinal cord signal and morphology.  Normal cervical vertebral height and alignment. No marrow edema or evidence of acute osseous abnormality.  Visualized paraspinal soft tissues are within normal limits.  No significant cervical spine degenerative changes.  No spinal stenosis or neural impingement.  IMPRESSION: 1.  Normal noncontrast MRI  appearance of the cervical spine. 2.  Thoracic spine findings are below.  MRI THORACIC SPINE WITHOUT CONTRAST  Technique: Multiplanar and multiecho pulse sequences of the thoracic spine were obtained without intravenous contrast.  Findings:  Normal thoracic vertebral height and alignment. No marrow edema or evidence of acute osseous abnormality.  Normal bone marrow signal. No marrow edema or evidence of acute osseous abnormality.  Visualized paraspinal soft tissues are within normal limits.  Trace layering pleural effusions.  Otherwise negative visualized thoracic viscera negative visualized upper abdominal viscera.  Normal thoracic spinal cord signal and morphology.  The conus medullaris occurs below the L1 level and is only partially visualized.  Normal thoracic spinal canal patency.  No thoracic spine disc degeneration.  No spinal or foraminal stenosis.  IMPRESSION: 1.  Normal noncontrast MRI appearance of the thoracic spine. 2.  Trace layering pleural effusions.   Original Report Authenticated By: Erskine Speed, M.D.    US Transvaginal Non-ob  07/22/2012  *RADIOLOGY REPORT*  Clinical Data: Passing vaginal clots; patient needs heparin for portal vein thrombosis.  TRANSABDOMINAL AND TRANSVAGINAL ULTRASOUND OF PELVIS Technique:  Both transabdominal and transvaginal ultrasound examinations of the pelvis were performed. Transabdominal technique was performed for global imaging of the pelvis including uterus, ovaries, adnexal regions, and pelvic cul-de-sac.  It was necessary to proceed with endovaginal exam following the transabdominal exam to visualize the ovaries in greater detail.  Comparison:  None  Findings:  Uterus: Normal in size and appearance; measures 8.0 x 3.2 x 4.0 cm.  Endometrium: Normal in thickness and appearance; measures 1.0 cm in thickness.  Right ovary:  Normal appearance/no adnexal mass; measures 3.3 x 2.5 x 2.1 cm.  Left ovary: Normal appearance/no adnexal mass; measures 3.6 x 2.5 x 1.8 cm.   Other findings: No free fluid is seen within the pelvic cul-de-sac.  Note is made of prominence of the periuterine vasculature; given apparent reflux of contrast along the ovarian veins on CT, this could reflect pelvic congestion syndrome in the appropriate clinical situation.  IMPRESSION:  1.  No abnormality seen to correspond to the patient's passage of clots; the uterus is unremarkable in appearance. 2.  Prominent periuterine vasculature noted; given apparent reflux of contrast along the ovarian veins on recent CT, this could reflect pelvic congestion syndrome in the appropriate clinical situation.  Ovaries unremarkable in appearance.   Original Report Authenticated By: Tonia Ghent, M.D.    US Pelvis Complete  07/22/2012  *RADIOLOGY REPORT*  Clinical Data: Passing vaginal clots; patient needs heparin for portal vein thrombosis.  TRANSABDOMINAL AND TRANSVAGINAL ULTRASOUND OF PELVIS Technique:  Both transabdominal and transvaginal ultrasound examinations of the pelvis were performed. Transabdominal technique was performed for global imaging of the pelvis including uterus, ovaries, adnexal regions, and pelvic cul-de-sac.  It was necessary to proceed with endovaginal exam following the transabdominal exam to visualize the ovaries in greater detail.  Comparison:  None  Findings:  Uterus: Normal in size and appearance; measures 8.0 x 3.2 x 4.0 cm.  Endometrium: Normal in thickness and appearance; measures 1.0 cm in thickness.  Right ovary:  Normal appearance/no adnexal mass; measures 3.3 x 2.5 x 2.1 cm.  Left  ovary: Normal appearance/no adnexal mass; measures 3.6 x 2.5 x 1.8 cm.  Other findings: No free fluid is seen within the pelvic cul-de-sac.  Note is made of prominence of the periuterine vasculature; given apparent reflux of contrast along the ovarian veins on CT, this could reflect pelvic congestion syndrome in the appropriate clinical situation.  IMPRESSION:  1.  No abnormality seen to correspond to the  patient's passage of clots; the uterus is unremarkable in appearance. 2.  Prominent periuterine vasculature noted; given apparent reflux of contrast along the ovarian veins on recent CT, this could reflect pelvic congestion syndrome in the appropriate clinical situation.  Ovaries unremarkable in appearance.   Original Report Authenticated By: Tonia Ghent, M.D.    Ct Abdomen Pelvis W Contrast  07/22/2012  *RADIOLOGY REPORT*  Clinical Data: Left lower quadrant pain, status post diagnostic laparoscopy for ovarian cysts, prior cholecystectomy  CT ABDOMEN AND PELVIS WITH CONTRAST  Technique:  Multidetector CT imaging of the abdomen and pelvis was performed following the standard protocol during bolus administration of intravenous contrast.  Contrast: OMNIPAQUE IOHEXOL 300 MG/ML  SOLN  Comparison: 10/10/2010  Findings: Lung bases are clear.  Filling defect within branches of the anterior right portal vein leading to the right hepatic dome (series 2/image 20).  Heterogeneous appearance of the SMV could reflect mixing artifact (series 2/image 34), however the unopacified jejunal branches within the left lower quadrant are also mildly increased in caliber (series 2/image 39).  This raises concern for possible additional thrombus.  Liver, spleen, pancreas, and adrenal glands are otherwise within normal limits.  Status post cholecystectomy.  No intrahepatic or extrahepatic ductal dilatation.  6 mm left renal cyst (series 2/image 30).  No hydronephrosis.  No evidence of bowel obstruction.  No associated changes in the bowel to suggest ischemia.  Normal appendix.  No evidence of abdominal aortic aneurysm.  Suspicious abdominopelvic lymphadenopathy.  Trace pelvic ascites, likely physiologic.  Uterus and bilateral ovaries are unremarkable.  Bladder is within normal limits.  Mild postsurgical changes in the anterior abdominal wall.  Visualized osseous structures are within normal limits.  IMPRESSION: Portal vein  thrombosis within branches of the right anterior portal vein.  Possible thrombus within jejunal mesenteric branches in the left lower quadrant (versus mixing artifact).  No associated changes in the bowel to suggest ischemia.  These results will be called to the ordering clinician or representative by the Radiologist Assistant, and communication documented in the PACS Dashboard.   Original Report Authenticated By: Charline Bills, M.D.    [place] Art/ven Flow Abd Pelv Doppler  07/24/2012  *RADIOLOGY REPORT*  Clinical Data: Portal vein thrombosis by recent CT  DUPLEX ULTRASOUND OF LIVER  Technique:  Color and duplex Doppler ultrasound was performed to evaluate the hepatic in-flow and out-flow vessels.  Comparison:  07/22/2012  Portal Vein Velocities: Main:      28 cm/sec Right:      21 cm/sec Left:        16 cm/sec  Hepatic Vein Velocities: Right:     23 cm/sec Middle:  24 cm/sec Left:       35 cm/sec  Hepatic Artery Velocity:   54 cm/sec Splenic Vein Velocity:      35 cm/sec  Varices:   Not visualized Ascites:   Not visualize  Findings: The hepatic, splenic, main portal vein, and left portal vein all appear patent with normal directional flow.  Compared with the recent CT, there does appear to be some degree of thrombus within the  peripheral intrahepatic right portal branches, better demonstrated by the CT 2 days ago.  This is not as well delineated by venous Doppler.  IMPRESSION: Patent main portal vein, portal vein confluence, and left portal vein.  Patent splenic vein and hepatic veins.  Some degree of thrombus within the right portal vein intrahepatic branches, better demonstrated by the comparison CT.  Normal portal vein hepatopedal flow and hepatic vein hepatofugal low.   Original Report Authenticated By: Judie Petit. Shick, M.D.     Labs: Basic Metabolic Panel:  Recent Labs Lab 07/21/12 2217  NA 140  K 3.7  CL 102  CO2 28  GLUCOSE 72  BUN 13  CREATININE 0.65  CALCIUM 9.1   Liver Function  Tests:  Recent Labs Lab 07/21/12 2217  AST 15  ALT 8  ALKPHOS 30*  BILITOT 0.5  PROT 6.8  ALBUMIN 3.7   CBC:  Recent Labs Lab 07/21/12 2217 07/23/12 0625 07/24/12 0432 07/25/12 0450  WBC 6.7 4.6 4.4 4.4  NEUTROABS 4.4  --   --   --   HGB 12.6 11.8* 12.5 12.1  HCT 36.8 35.9* 38.0 36.6  MCV 87.0 88.4 89.2 89.3  PLT 144* 151 159 151   Signed:  GHERGHE, COSTIN  [facility] 07/25/2012, 3:14 PM

## 2012-07-25 NOTE — Consult Note (Signed)
Referring MD:  PCP:  None   Reason for Referral: Hematology consultation in a young woman found to have a portal vein thrombosis.   Chief Complaint  Patient presents with  . 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 She has been in overall excellent health. About 2 years ago she had a cholecystectomy when she developed atypical regurgitation after meals and lost about 20 pounds. Symptoms resolved with the surgery. Over the last year she has had persistent right lower quadrant  abdominal pain felt possibly secondary to ruptured ovarian cysts. Due to persistent symptoms, she underwent a laparoscopic exploration on July 19, 2012. This required a small incision at the level of the umbilicus and a second small incision in the suprapubic region. There were no abnormal findings. No evidence for ovarian cyst formation or endometriosis.  The patient was discharged home the same day. The next morning she woke up and noticed blurry vision and difficulty moving her legs. She had a brief reevaluation at [facility] emergency department and then elected to come to [facility] for further evaluation. There were no objective findings on neurologic examination. MRI studies of the brain, cervical spine, and thoracic spine were all normal. Her symptoms resolved completely over the next 48 hours. It is not clear whether this was an atypical reaction to short acting anesthetics, a scopolamine patch, or Percocet.  She reported some intermittent vaginal bleeding. This led to a CT scan of the abdomen and pelvis done on February 10. Gallbladder is surgically absent. Liver, spleen, pancreas, adrenal glands are normal. There appeared to be thrombosis within the branches of the right anterior portal vein with possible thrombus within jejunal mesenteric branches in the left lower quadrant versus mixing artifact. Uterus and ovaries appeared unremarkable. I believe there is  a typographical error in the report. After a sentence that says "no evidence of abdominal aortic aneurysm" there is a sentence that says "suspicious abdominopelvic lymphadenopathy". I believe the radiologist meant to say  "no" before  the word suspicious. Suspicious adenopathy not mentioned in the impression and I do not see any suspicious adenopathy on my review of the images. She is not having any upper abdominal pain or epigastric pain.  She has no signs or symptoms of a collagen vascular disorder. No history of diabetes or pancreatitis. She has no history of hepatitis, yellow jaundice, or mononucleosis. Current liver profile is perfectly normal. No history of heavy alcohol use. She is not on any oral contraceptives.  Her sister who is 36 years old had a DVT in her leg after trauma. Her father age 3 had a DVT in his leg and took Coumadin for one year. No history of blood clots in her mother, or her 2 brothers.        Past Medical History  Diagnosis Date  . No pertinent past medical history   : She gives no history of having a duodenal ulcer despite some notes in the chart record. She is not on a chronic PPI.   Past Surgical History  Procedure Laterality Date  . Cholecystectomy    . Laparoscopy N/A 07/19/2012    Procedure: LAPAROSCOPY DIAGNOSTIC;  Surgeon: Meriel Pica, MD;  Location: [facility];  Service: Gynecology;  Laterality: N/A;  :  . coumadin book   Does not apply Once  . enoxaparin (LOVENOX) injection  1 mg/kg Subcutaneous Q12H  . Warfarin - Pharmacist Dosing Inpatient   Does  not apply q1800  :  No Known Allergies:  Family History  Problem Relation Age of Onset  . Hypertension Mother   :  History   Social History  . Marital Status: Married    Spouse Name: N/A    Number of Children: N/A  . Years of Education: N/A   Occupational History  . Not on file.   Social History Main Topics  . Smoking status: Former Smoker -- 5 years    Types: Cigarettes     Quit date: 08/01/2010  . Smokeless tobacco: Not on file  . Alcohol Use: Yes     Comment: occasionally  . Drug Use: No  . Sexually Active: Yes    Birth Control/ Protection: None   Other Topics Concern  . Not on file   Social History Narrative  . No narrative on file  :  ROS: Eyes: See above Throat:  Neck: Resp: No dyspnea  Cardio: No palpitations GI: See above Extremities: No calf swelling or tenderness Lymph nodes:  Neurologic: See above  Skin: . No rash or ecchymosis Genitourinary: See above   Vitals: Filed Vitals:   07/25/12 0557  BP: 100/66  Pulse: 70  Temp: 97.7 F (36.5 C)  Resp: 16    PHYSICAL EXAM: General appearance: Thin but well-nourished Caucasian woman Head: Normal Eyes: Normal Throat: No erythema or exudate Neck:  Lymph Nodes: No cervical, supraclavicular, or axillary adenopathy Resp: Clear to auscultation Cardio: Regular rhythm no murmur Breasts: Recent exam normal by her OB/GYN GI: The abdomen is soft, nontender, well healing surgical incisions at the umbilicus and suprapubic region from recent laparoscopy GU: Not examined Extremities: No edema, no calf tenderness Vascular: Carotids 1+ no bruits; dorsalis pedis and posterior tibial pulses 2+ symmetric; radial pulses 2+ symmetric, ulnar pulses 1+ symmetric Neurologic: She is alert and oriented, PERRLA, motor strength 5 over 5, reflexes 1+ symmetric Skin: No rash or ecchymosis  Labs:   Recent Labs  07/24/12 0432 07/25/12 0450  WBC 4.4 4.4  HGB 12.5 12.1  HCT 38.0 36.6  PLT 159 151   No results found for this basename: NA, K, CL, CO2, GLUCOSE, BUN, CREATININE, CALCIUM,  in the last 72 hours    Images Studies/Results:  [place] Art/ven Flow Abd Pelv Doppler  07/24/2012  *RADIOLOGY REPORT*  Clinical Data: Portal vein thrombosis by recent CT  DUPLEX ULTRASOUND OF LIVER  Technique:  Color and duplex Doppler ultrasound was performed to evaluate the hepatic in-flow and out-flow vessels.   Comparison:  07/22/2012  Portal Vein Velocities: Main:      28 cm/sec Right:      21 cm/sec Left:        16 cm/sec  Hepatic Vein Velocities: Right:     23 cm/sec Middle:  24 cm/sec Left:       35 cm/sec  Hepatic Artery Velocity:   54 cm/sec Splenic Vein Velocity:      35 cm/sec  Varices:   Not visualized Ascites:   Not visualize  Findings: The hepatic, splenic, main portal vein, and left portal vein all appear patent with normal directional flow.  Compared with the recent CT, there does appear to be some degree of thrombus within the peripheral intrahepatic right portal branches, better demonstrated by the CT 2 days ago.  This is not as well delineated by venous Doppler.  IMPRESSION: Patent main portal vein, portal vein confluence, and left portal vein.  Patent splenic vein and hepatic veins.  Some degree of thrombus within  the right portal vein intrahepatic branches, better demonstrated by the comparison CT.  Normal portal vein hepatopedal flow and hepatic vein hepatofugal low.   Original Report Authenticated By: Judie Petit. Miles Costain, M.D.         Assessment and Plan:  Principal Problem:   Lower extremity weakness Active Problems:   RLQ abdominal pain   Portal vein thrombosis  Portal vein thrombosis subacute versus chronic  Upper abdominal symptoms are not part of her current clinical presentation. She has none of the usual risk factors for portal vein thrombosis with the exception, perhaps, of inflammatory changes related to cholecystectomy 2 years ago. Current color arterial venous Doppler study of the portal and mesenteric vessels shows a small area of nonocclusive clot in a branch of the right portal vein with no clot in the left branch or the main portal vein. I suspect that her clot occurred remotely.  The natural history of portal vein thrombosis is for the veins to recanalize and this appears to have already occurred in this patient. The value of anticoagulation is uncertain under these  circumstances. In the past, we would not routinely anticoagulate portal vein thrombosis. The current literature supports anticoagulation for a limited length of time in patients found to have acute or subacute thrombosis. I think it is reasonable to offer her short term anticoagulation in view of her family history of clotting until we have more information with respect to a hypercoagulation evaluation.  There is no literature at this time to support the use of the new oral anticoagulants either Xarelto or Pradaxa for blood clots in unusual sites. I would therefore recommend standard Coumadin.  Recommendation: Lovenox/Coumadin. INR target range 2-3. Start Coumadin 5 mg daily. I have made arrangements for her to be followed in our office Coumadin clinic and she has an appointment for 1 PM on Monday, February 17. I will schedule a followup visit with me when results of outstanding special hematology tests are unavailable.   Please note in the text of my consult above, I think there is a typographical error in the CT abdomen pelvis report. There is no evidence that she has any suspicious pelvic adenopathy.  Thank you for this consultation.   My impressions discussed with the hospital attending physician Dr. Elvera Lennox.   GRANFORTUNA,JAMES M 07/25/2012, 9:20 AM

## 2012-07-25 NOTE — Progress Notes (Signed)
New Coumadin Clinic Consult:  Patient to be discharged from [facility] on 07/25/12. Diagnosis = portal vein thrombosis. Hypercoag panel unremarkable per Dr. Reece Agar, but there seems to be a family history of clotting.   Pt started Coumadin on 07/23/12.  Received Coumadin 7.5mg  on 2/11 and 2/12.  Daily dose of 5mg  daily started on 07/25/12.  First follow up INR check to be performed in the [facility] ER on 07/26/12, then to be seen in [facility] for first visit on Monday, 07/29/12.

## 2012-07-26 ENCOUNTER — Encounter (HOSPITAL_COMMUNITY): Payer: Self-pay

## 2012-07-26 ENCOUNTER — Emergency Department (HOSPITAL_COMMUNITY)
Admission: EM | Admit: 2012-07-26 | Discharge: 2012-07-26 | Disposition: A | Discharge status: Home / Self Care | Payer: BC Managed Care – PPO | Source: Home / Self Care

## 2012-07-26 ENCOUNTER — Telehealth (HOSPITAL_COMMUNITY): Payer: Self-pay

## 2012-07-26 DIAGNOSIS — I81 Portal vein thrombosis: Secondary | ICD-10-CM

## 2012-07-26 LAB — PROTEIN C, TOTAL: Protein C, Total: 95 % (ref 72–160)

## 2012-07-26 LAB — PROTIME-INR
INR: 2.59 — ABNORMAL HIGH (ref 0.00–1.49)
Prothrombin Time: 26.5 seconds — ABNORMAL HIGH (ref 11.6–15.2)

## 2012-07-26 LAB — PROTEIN S, TOTAL: Protein S Ag, Total: 58 % — ABNORMAL LOW (ref 60–150)

## 2012-07-26 NOTE — ED Notes (Signed)
Follow up hospital needs pt/inr

## 2012-07-29 ENCOUNTER — Ambulatory Visit: Payer: BC Managed Care – PPO | Admitting: Pharmacist

## 2012-07-29 ENCOUNTER — Other Ambulatory Visit: Payer: BC Managed Care – PPO | Admitting: Lab

## 2012-07-29 DIAGNOSIS — I81 Portal vein thrombosis: Secondary | ICD-10-CM

## 2012-07-29 LAB — PROTHROMBIN TIME
INR: 4.03 — ABNORMAL HIGH (ref <1.50)
Prothrombin Time: 36.8 seconds — ABNORMAL HIGH (ref 11.6–15.2)

## 2012-07-29 LAB — PROTIME-INR

## 2012-07-29 LAB — POCT INR: INR: 4.03

## 2012-07-29 MED ORDER — WARFARIN SODIUM 4 MG PO TABS: 4.0000 mg | ORAL_TABLET | Freq: Every day | ORAL | Status: DC

## 2012-07-29 NOTE — Progress Notes (Signed)
INR = 4.03 (venopuncture; sent out for confirmation) Pt had 2 days of Coumadin 7.5 mg in the hospital then 5 mg daily started at discharge. INR on 07/26/12 = 2.59; in ED.  Pt was told to stop Lovenox, which she reports she did. Dr. Cyndie Chime does not plan for pt to be on long-term anticoagulation.  Will need to determine end date soon. I s/w pt over phone after discussing w/ Dr. Cyndie Chime-- pt will decrease her Coumadin to 4 mg/day. Repeat INR this Friday. I briefly discussed diet/vit K intake w/ pt today; she eats little or no vit K containing foods.  She drinks EtOH socially, maybe 1 glass of wine on the weekends. We can discuss diet w/ pt on Friday if needed.  She seems to understand this component, especially since her tx is short-term. Marily Lente, Pharm.D.

## 2012-08-01 ENCOUNTER — Other Ambulatory Visit: Payer: Self-pay | Admitting: Oncology

## 2012-08-01 ENCOUNTER — Telehealth: Payer: Self-pay | Admitting: Oncology

## 2012-08-01 DIAGNOSIS — R1031 Right lower quadrant pain: Secondary | ICD-10-CM

## 2012-08-01 DIAGNOSIS — I81 Portal vein thrombosis: Secondary | ICD-10-CM

## 2012-08-01 NOTE — Telephone Encounter (Signed)
Talked to patient and gav her appt for 08/16/12 lab, financial and MD appts

## 2012-08-02 ENCOUNTER — Other Ambulatory Visit: Payer: BC Managed Care – PPO | Admitting: Lab

## 2012-08-02 ENCOUNTER — Ambulatory Visit (HOSPITAL_BASED_OUTPATIENT_CLINIC_OR_DEPARTMENT_OTHER): Payer: BC Managed Care – PPO | Admitting: Pharmacist

## 2012-08-02 ENCOUNTER — Other Ambulatory Visit: Payer: Self-pay | Admitting: Pharmacist

## 2012-08-02 DIAGNOSIS — I81 Portal vein thrombosis: Secondary | ICD-10-CM

## 2012-08-02 LAB — PROTHROMBIN TIME
INR: 5.58 (ref <1.50)
Prothrombin Time: 46.8 seconds — ABNORMAL HIGH (ref 11.6–15.2)

## 2012-08-02 LAB — PROTIME-INR

## 2012-08-02 LAB — POCT INR: INR: 5.58

## 2012-08-02 NOTE — Progress Notes (Signed)
INR above goal despite dose decrease to 4mg  daily on 07/29/12. No bleeding or unusual bruising. No problems to report regarding anticoagulation. Hold Coumadin today (2/21), tomorrow (2/22) and Sunday (2/23) Repeat INR on 08/05/12 at 2pm for lab and 2:15pm for coumadin clinic.  Pt aware to go to ED if she develops any bleeding over the weekend that won't stop in a reasonable amount of time. She is also aware to avoid sharp objects, etc.

## 2012-08-02 NOTE — Patient Instructions (Addendum)
Hold Coumadin today (2/21), tomorrow (2/22) and Sunday (2/23) Repeat INR on 08/05/12 at 2pm for lab and 2:15pm for coumadin clinic.

## 2012-08-05 ENCOUNTER — Ambulatory Visit (HOSPITAL_BASED_OUTPATIENT_CLINIC_OR_DEPARTMENT_OTHER): Payer: BC Managed Care – PPO | Admitting: Pharmacist

## 2012-08-05 ENCOUNTER — Other Ambulatory Visit (HOSPITAL_BASED_OUTPATIENT_CLINIC_OR_DEPARTMENT_OTHER): Payer: BC Managed Care – PPO

## 2012-08-05 DIAGNOSIS — I81 Portal vein thrombosis: Secondary | ICD-10-CM

## 2012-08-05 LAB — PROTIME-INR
INR: 2.9 (ref 2.00–3.50)
Protime: 34.8 Seconds — ABNORMAL HIGH (ref 10.6–13.4)

## 2012-08-05 NOTE — Progress Notes (Signed)
INR therapeutic today (2.9) after holding Coumadin for 3 days. Pt was previously on Coumadin 4mg  daily. No complaints.  No recent changes in diet.  Has started using Tylenol Cold OTC to relieve head-cold symptoms on Friday.  Will have patient resume Coumadin 2mg  daily today, and recheck INR in 4 days to assess response.

## 2012-08-09 ENCOUNTER — Other Ambulatory Visit (HOSPITAL_BASED_OUTPATIENT_CLINIC_OR_DEPARTMENT_OTHER): Payer: BC Managed Care – PPO | Admitting: Lab

## 2012-08-09 ENCOUNTER — Ambulatory Visit (HOSPITAL_BASED_OUTPATIENT_CLINIC_OR_DEPARTMENT_OTHER): Payer: BC Managed Care – PPO | Admitting: Pharmacist

## 2012-08-09 DIAGNOSIS — I81 Portal vein thrombosis: Secondary | ICD-10-CM

## 2012-08-09 LAB — PROTIME-INR
INR: 2.1 (ref 2.00–3.50)
Protime: 25.2 Seconds — ABNORMAL HIGH (ref 10.6–13.4)

## 2012-08-09 LAB — POCT INR: INR: 2.1

## 2012-08-09 NOTE — Patient Instructions (Addendum)
INR at goal at 2.1  No Changes Continue 2 mg coumadin daily. Return next Friday 08/16/12. Lab at 330 and coumadin clinic at 345

## 2012-08-09 NOTE — Progress Notes (Signed)
INR at goal. Pt reports no bleeding/bruising or swelling/pain. Pt restarted back on a lower dose (2mg  daily) on Monday 2/24 this week after holding for ~ 3 days due to INR > 5. Will continue the lower dose of 2 mg daily as INR is at 2.1 and will check next Friday when patient returns for follow up visit with Dr. Cyndie Chime on 08/16/12. Pt to have lab drawn and 3:30pm, then coumadin clinic at 3:45pm, financial assistance appointment at 4pm and F/U with Dr. Reece Agar at 4:30.

## 2012-08-16 ENCOUNTER — Ambulatory Visit (HOSPITAL_BASED_OUTPATIENT_CLINIC_OR_DEPARTMENT_OTHER): Payer: BC Managed Care – PPO | Admitting: Oncology

## 2012-08-16 ENCOUNTER — Ambulatory Visit: Payer: BC Managed Care – PPO | Admitting: Pharmacist

## 2012-08-16 ENCOUNTER — Ambulatory Visit: Payer: BC Managed Care – PPO

## 2012-08-16 ENCOUNTER — Other Ambulatory Visit (HOSPITAL_BASED_OUTPATIENT_CLINIC_OR_DEPARTMENT_OTHER): Payer: BC Managed Care – PPO | Admitting: Lab

## 2012-08-16 ENCOUNTER — Telehealth: Payer: Self-pay | Admitting: Oncology

## 2012-08-16 VITALS — BP 109/71 | HR 69 | Temp 98.1°F | Resp 20 | Ht 62.0 in | Wt 113.1 lb

## 2012-08-16 DIAGNOSIS — I81 Portal vein thrombosis: Secondary | ICD-10-CM

## 2012-08-16 DIAGNOSIS — R1031 Right lower quadrant pain: Secondary | ICD-10-CM

## 2012-08-16 LAB — CBC WITH DIFFERENTIAL/PLATELET
BASO%: 2.3 % — ABNORMAL HIGH (ref 0.0–2.0)
Basophils Absolute: 0.1 10*3/uL (ref 0.0–0.1)
EOS%: 5 % (ref 0.0–7.0)
Eosinophils Absolute: 0.3 10*3/uL (ref 0.0–0.5)
HCT: 38.5 % (ref 34.8–46.6)
HGB: 12.8 g/dL (ref 11.6–15.9)
LYMPH%: 31.3 % (ref 14.0–49.7)
MCH: 29.3 pg (ref 25.1–34.0)
MCHC: 33.3 g/dL (ref 31.5–36.0)
MCV: 88.1 fL (ref 79.5–101.0)
MONO#: 0.4 10*3/uL (ref 0.1–0.9)
MONO%: 7.9 % (ref 0.0–14.0)
NEUT#: 2.8 10*3/uL (ref 1.5–6.5)
NEUT%: 53.5 % (ref 38.4–76.8)
Platelets: 150 10*3/uL (ref 145–400)
RBC: 4.37 10*6/uL (ref 3.70–5.45)
RDW: 12.8 % (ref 11.2–14.5)
WBC: 5.3 10*3/uL (ref 3.9–10.3)
lymph#: 1.6 10*3/uL (ref 0.9–3.3)

## 2012-08-16 LAB — PROTIME-INR
INR: 2.1 (ref 2.00–3.50)
Protime: 25.2 Seconds — ABNORMAL HIGH (ref 10.6–13.4)

## 2012-08-16 NOTE — Patient Instructions (Signed)
Stay on coumadin for 6 months We will then wait for 4 weeks then repeat protein S levels

## 2012-08-16 NOTE — Progress Notes (Signed)
Hematology and Oncology Follow Up Visit  Brad Mcgaughy [id number] 26-Jun-1975 37 y.o. 08/16/2012 5:39 PM   Principle Diagnosis: Encounter Diagnoses  Name Primary?  . RLQ abdominal pain   . Portal vein thrombosis Yes  . 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 She presented to [facility] with abrupt onset of unexplained neurologic symptoms with blurred vision and lower extremity paresis and was admitted for urgent evaluation on 07/21/2012.Neurologic exam and a number of radiographic studies including MRI of the brain and spine were unremarkable. Her symptoms resolved spontaneously over the next 24 hours.   Due to complaints of vaginal bleeding, a pelvic ultrasound and transvaginal Doppler and a CT scan of the abdomen and pelvis was done on February 10 which showed a thrombosis within branches of the right anterior portal vein. Of note, she had a recent laparoscopy on February 7 to further explore persistent atypical right lower quadrant abdominal pain. She had a previous cholecystectomy 2 years ago.  I saw the patient in consultation. Please see my note of 07/25/2012. I obtained a color and duplex vascular Doppler study to evaluate hepatic blood flow. The main portal vein and left portal veins were patent. There was non-occlusive thrombus in the peripheral intrahepatic right portal vein branches. Hepatic and splenic veins were patent.  I did recommend a short-term anticoagulation. Of note the patient was not having any acute abdominal symptoms to suggest an acute portal vein thrombosis. I believe that if she had a portal vein thrombosis it probably happened 2 years ago in relation to her gallbladder surgery. There were no signs of an acute inflammatory process at the time of laparoscopy done just 3 days prior to the hospital admission.  She is having her Coumadin monitored in  my office. She is   very sensitive to the drug and we've had to make a number of dose reductions for supratherapeutic INR is. She is currently down to 4 mg daily. INR today 2.1  She continues to have atypical chronic right lower quadrant abdominal pain. There was no evidence for any ovarian mass, cyst, or endometriosis the time of recent laparoscopy.  Symptoms of inability to eat and post prandial regurgitation and weight loss resolved after her previous cholecystectomy.  None of her neurologic symptoms have recurred.  We now have available results of a hypercoagulation profile drawn in the hospital prior to starting Coumadin. Of note there is a borderline decrease in the total protein S. level at 58% of control (60-150), with a moderate decrease in functional protein S. of 53% (control 69-129) done 07/22/2012. Protein C total and protein C activity are normal, in fact functional protein C level is higher than the highest control value. This serves as a good internal control since if she did receive some doses of Coumadin I would expect both protein S and C. to be decreased. Antithrombin level was normal. Lupus-type anticoagulant not detected. No antibodies against anticardiolipin or beta-2 glycoprotein 1. Normal plasma homocysteine. Factor V Leiden gene mutation not detected. For some reason, the prothrombin gene mutation was not done.  Her father who is currently 85 years old and living in another state has had problem with blood clots but was never tested for any genetic risk factors. A sister had a DVT in her leg following trauma No other family members with clotting problems.   Medications:  reviewed  Allergies: No Known Allergies  Review of Systems: Constitutional:    no constitutional symptoms  Respiratory: no cough or dyspnea  Cardiovascular:   no chest pain or palpitations  Gastrointestinal: see above  Genito-Urinary:  see above  Musculoskeletal: no muscle, bone, or joint pain   Neurologic: Resolved lower extremity weakness and blurred vision Skin: no rash or ecchymosis  Remaining ROS negative.  Physical Exam: Blood pressure 109/71, pulse 69, temperature 98.1 F (36.7 C), temperature source Oral, resp. rate 20, height 5\' 2"  (1.575 m), weight 113 lb 1.6 oz (51.302 kg). Wt Readings from Last 3 Encounters:  08/16/12 113 lb 1.6 oz (51.302 kg)  07/22/12 112 lb (50.803 kg)  07/21/12 112 lb (50.803 kg)     General appearance:  thin, Caucasian woman HENNT:  pharynx no erythema or exudate Lymph nodes:  no adenopathy  Breasts: Lungs: clear to auscultation resonant to percussion Heart: regular rhythm no murmur  Abdomen: soft, nontender, no mass, no organomegaly  Extremities: no edema, no calf tenderness  Vascular: no cyanosis  Neurologic: Mental status intact, PERRLA, optic discs sharp, vessels normal, motor strength 5 over 5, reflexes 1+ symmetric Skin:no rash or ecchymosis   Lab Results: Lab Results  Component Value Date   WBC 5.3 08/16/2012   HGB 12.8 08/16/2012   HCT 38.5 08/16/2012   MCV 88.1 08/16/2012   PLT 150 08/16/2012     Chemistry      Component Value Date/Time   NA 140 07/21/2012 2217   K 3.7 07/21/2012 2217   CL 102 07/21/2012 2217   CO2 28 07/21/2012 2217   BUN 13 07/21/2012 2217   CREATININE 0.65 07/21/2012 2217      Component Value Date/Time   CALCIUM 9.1 07/21/2012 2217   ALKPHOS 30* 07/21/2012 2217   AST 15 07/21/2012 2217   ALT 8 07/21/2012 2217   BILITOT 0.5 07/21/2012 2217       Radiological Studies: Mr Brain Wo Contrast  07/22/2012  *RADIOLOGY REPORT*  Clinical Data:  37 year old female with unexplained sudden onset bilateral lower extremity weakness and blurred vision following exploratory laparotomy on 07/19/2012 for ovarian cysts.  Comparison:   None.  MRI HEAD WITHOUT CONTRAST  Technique:  Multiplanar, multiecho pulse sequences of the brain and surrounding structures were obtained without intravenous contrast.  Findings:  Normal cerebral volume. No  restricted diffusion to suggest acute infarction.  No midline shift, mass effect, evidence of mass lesion, ventriculomegaly, extra-axial collection or acute intracranial hemorrhage.  Cervicomedullary junction and pituitary are within normal limits.  Major intracranial vascular flow voids are preserved. Wallace Cullens and white matter signal is within normal limits throughout the brain.  Cervical spine findings are described below.  Visualized orbit soft tissues are within normal limits.  Visualized paranasal sinuses and mastoids are clear.  Negative scalp soft tissues. Visualized bone marrow signal is within normal limits.  IMPRESSION:   1. Normal noncontrast MRI appearance of the brain. 2.  Cervical and thoracic spine findings are below.  MRI CERVICAL SPINE WITHOUT CONTRAST  Technique:  Multiplanar and multiecho pulse sequences of the cervic al spine, to include the craniocervical junction and cervicothoraci c junction, were obtained according to standard protocol without intravenous contrast.  Findings:  Cervicomedullary junction is within normal limits. Normal cervical spinal cord signal and morphology.  Normal cervical vertebral height and alignment. No marrow edema or evidence of acute osseous abnormality.  Visualized paraspinal soft tissues are within normal limits.  No significant cervical spine degenerative changes.  No  spinal stenosis or neural impingement.  IMPRESSION: 1.  Normal noncontrast MRI appearance of the cervical spine. 2.  Thoracic spine findings are below.  MRI THORACIC SPINE WITHOUT CONTRAST  Technique: Multiplanar and multiecho pulse sequences of the thoracic spine were obtained without intravenous contrast.  Findings:  Normal thoracic vertebral height and alignment. No marrow edema or evidence of acute osseous abnormality.  Normal bone marrow signal. No marrow edema or evidence of acute osseous abnormality.  Visualized paraspinal soft tissues are within normal limits.  Trace layering pleural effusions.   Otherwise negative visualized thoracic viscera negative visualized upper abdominal viscera.  Normal thoracic spinal cord signal and morphology.  The conus medullaris occurs below the L1 level and is only partially visualized.  Normal thoracic spinal canal patency.  No thoracic spine disc degeneration.  No spinal or foraminal stenosis.  IMPRESSION: 1.  Normal noncontrast MRI appearance of the thoracic spine. 2.  Trace layering pleural effusions.   Original Report Authenticated By: Erskine Speed, M.D.    Mr Cervical Spine Wo Contrast    US Transvaginal Non-ob  07/22/2012  *RADIOLOGY REPORT*  Clinical Data: Passing vaginal clots; patient needs heparin for portal vein thrombosis.  TRANSABDOMINAL AND TRANSVAGINAL ULTRASOUND OF PELVIS Technique:  Both transabdominal and transvaginal ultrasound examinations of the pelvis were performed. Transabdominal technique was performed for global imaging of the pelvis including uterus, ovaries, adnexal regions, and pelvic cul-de-sac.  It was necessary to proceed with endovaginal exam following the transabdominal exam to visualize the ovaries in greater detail.  Comparison:  None  Findings:  Uterus: Normal in size and appearance; measures 8.0 x 3.2 x 4.0 cm.  Endometrium: Normal in thickness and appearance; measures 1.0 cm in thickness.  Right ovary:  Normal appearance/no adnexal mass; measures 3.3 x 2.5 x 2.1 cm.  Left ovary: Normal appearance/no adnexal mass; measures 3.6 x 2.5 x 1.8 cm.  Other findings: No free fluid is seen within the pelvic cul-de-sac.  Note is made of prominence of the periuterine vasculature; given apparent reflux of contrast along the ovarian veins on CT, this could reflect pelvic congestion syndrome in the appropriate clinical situation.  IMPRESSION:  1.  No abnormality seen to correspond to the patient's passage of clots; the uterus is unremarkable in appearance. 2.  Prominent periuterine vasculature noted; given apparent reflux of contrast along the  ovarian veins on recent CT, this could reflect pelvic congestion syndrome in the appropriate clinical situation.  Ovaries unremarkable in appearance.   Original Report Authenticated By: Tonia Ghent, M.D.     situation.  Ovaries unremarkable in appearance.   Original Report Authenticated By: Tonia Ghent, M.D.    .    [place] Art/ven Flow Abd Pelv Doppler  07/24/2012  *RADIOLOGY REPORT*  Clinical Data: Portal vein thrombosis by recent CT  DUPLEX ULTRASOUND OF LIVER  Technique:  Color and duplex Doppler ultrasound was performed to evaluate the hepatic in-flow and out-flow vessels.  Comparison:  07/22/2012  Portal Vein Velocities: Main:      28 cm/sec Right:      21 cm/sec Left:        16 cm/sec  Hepatic Vein Velocities: Right:     23 cm/sec Middle:  24 cm/sec Left:       35 cm/sec  Hepatic Artery Velocity:   54 cm/sec Splenic Vein Velocity:      35 cm/sec  Varices:   Not visualized Ascites:   Not visualize  Findings: The hepatic, splenic, main portal vein, and left portal  vein all appear patent with normal directional flow.  Compared with the recent CT, there does appear to be some degree of thrombus within the peripheral intrahepatic right portal branches, better demonstrated by the CT 2 days ago.  This is not as well delineated by venous Doppler.  IMPRESSION: Patent main portal vein, portal vein confluence, and left portal vein.  Patent splenic vein and hepatic veins.  Some degree of thrombus within the right portal vein intrahepatic branches, better demonstrated by the comparison CT.  Normal portal vein hepatopedal flow and hepatic vein hepatofugal low.   Original Report Authenticated By: Judie Petit. Miles Costain, M.D.     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 Given the unusual site of the clot and family history of clotting,  this may actually be a real finding and may influence recommendation for duration of  anticoagulation. On the short term, I am recommending 6 months of anticoagulation with Coumadin. I do not trust Xarelto in this situation. I gave her a prescription to mail to her father to have protein S levels checked. This will corroborate whether there is a genetic risk factor in play. When we stop her Coumadin in 6 months, we will repeat protein S levels to see if they are reproducible.  CC:. Dr Pamella Pert, [facility]; Dr. Richarda Overlie    Levert Feinstein, MD 3/7/20145:39 PM

## 2012-08-16 NOTE — Telephone Encounter (Signed)
Gave pt appt for June 2014 lab and MD °

## 2012-08-16 NOTE — Progress Notes (Signed)
INR therapeutic (2.1) today on 2mg  daily. No changes or complaints today.  Encouraged that INR is stable.  Will have pt continue current dose, and recheck INR in 2 weeks

## 2012-08-18 LAB — D-DIMER, QUANTITATIVE: D-Dimer, Quant: 0.27 ug/mL-FEU (ref 0.00–0.48)

## 2012-08-22 ENCOUNTER — Encounter: Payer: Self-pay | Admitting: Oncology

## 2012-08-30 ENCOUNTER — Other Ambulatory Visit: Payer: BC Managed Care – PPO | Admitting: Lab

## 2012-08-30 ENCOUNTER — Ambulatory Visit (HOSPITAL_BASED_OUTPATIENT_CLINIC_OR_DEPARTMENT_OTHER): Payer: BC Managed Care – PPO | Admitting: Pharmacist

## 2012-08-30 DIAGNOSIS — R1031 Right lower quadrant pain: Secondary | ICD-10-CM

## 2012-08-30 DIAGNOSIS — I81 Portal vein thrombosis: Secondary | ICD-10-CM

## 2012-08-30 LAB — POCT INR: INR: 1.7

## 2012-08-30 LAB — PROTIME-INR
INR: 1.7 — ABNORMAL LOW (ref 2.00–3.50)
Protime: 20.4 Seconds — ABNORMAL HIGH (ref 10.6–13.4)

## 2012-08-30 NOTE — Progress Notes (Signed)
INR below goal today. Unexplained decrease in INR. No missed doses. No changes in diet, medications, etc. No problems to report. No s/s of clotting. Pt has been on the lower end of the therapeutic range recently. (INR = 2.1 on 08/16/12 and 08/09/12 on 2mg  daily.) Portal vein thrombosis diagnosed in early Feb. Increase coumadin to 2.5mg  daily.  Begin Lovenox 80mg  daily.  Recheck INR in 5 days on 09/04/12; lab at 1:30pm and coumadin clinic at 1:45pm.  Lovenox 80mg  syringe (x 5 syringes) provided to pt (samples).

## 2012-08-30 NOTE — Patient Instructions (Signed)
Increase coumadin to 2.5mg  daily.  Begin Lovenox 80mg  daily.  Recheck INR in 5 days on 09/04/12; lab at 1:30pm and coumadin clinic at 1:45pm.

## 2012-09-02 LAB — PROTHROMBIN GENE MUTATION

## 2012-09-04 ENCOUNTER — Other Ambulatory Visit (HOSPITAL_BASED_OUTPATIENT_CLINIC_OR_DEPARTMENT_OTHER): Payer: BC Managed Care – PPO | Admitting: Lab

## 2012-09-04 ENCOUNTER — Ambulatory Visit (HOSPITAL_BASED_OUTPATIENT_CLINIC_OR_DEPARTMENT_OTHER): Payer: BC Managed Care – PPO | Admitting: Pharmacist

## 2012-09-04 DIAGNOSIS — I81 Portal vein thrombosis: Secondary | ICD-10-CM

## 2012-09-04 LAB — PROTIME-INR
INR: 1.9 — ABNORMAL LOW (ref 2.00–3.50)
Protime: 22.8 Seconds — ABNORMAL HIGH (ref 10.6–13.4)

## 2012-09-04 LAB — POCT INR: INR: 1.9

## 2012-09-04 NOTE — Patient Instructions (Addendum)
INR increasing slowly.  Take coumadin 3mg  today.  On 09/05/12, continue coumadin 2.5mg  daily.   Continue Lovenox 80mg  daily.  Recheck INR in 5 days on 09/09/12; lab at 3:45pm and coumadin clinic at 4pm.

## 2012-09-04 NOTE — Progress Notes (Addendum)
INR increasing slowly.  Coumadin 2.5mg  daily started on 08/30/12. Pt has taken 5 doses of coumadin 2.5mg . It may be a little early to evaluate full effect of Coumadin 2.5mg  daily.  Minimal bruising on abdomen from Lovenox injections. No other problems to report. No changes in medications, diet, etc. No missed doses of coumadin. Take coumadin 3mg  today.  On 09/05/12, continue coumadin 2.5mg  daily.   Continue Lovenox 80mg  daily.  Recheck INR in 5 days on 09/09/12; lab at 3:45pm and coumadin clinic at 4pm.  Lovenox 80mg  syringe (x 5 syringes) provided to pt (samples). Coumadin 1mg  tablet (x 1 tablet) provided to pt (samples).

## 2012-09-09 ENCOUNTER — Ambulatory Visit (HOSPITAL_BASED_OUTPATIENT_CLINIC_OR_DEPARTMENT_OTHER): Payer: BC Managed Care – PPO | Admitting: Pharmacist

## 2012-09-09 ENCOUNTER — Other Ambulatory Visit (HOSPITAL_BASED_OUTPATIENT_CLINIC_OR_DEPARTMENT_OTHER): Payer: BC Managed Care – PPO | Admitting: Lab

## 2012-09-09 DIAGNOSIS — I81 Portal vein thrombosis: Secondary | ICD-10-CM

## 2012-09-09 LAB — PROTIME-INR
INR: 2.3 (ref 2.00–3.50)
Protime: 27.6 Seconds — ABNORMAL HIGH (ref 10.6–13.4)

## 2012-09-09 LAB — POCT INR: INR: 2.3

## 2012-09-09 NOTE — Progress Notes (Signed)
INR = 2.3 Pt took 3 mg last Wednesday then 2.5 mg other days. She is also on Lovenox until INR at goal. Bruised L knee.  Minor.  Also w/ bruises on abdomen from Lovenox injections. No bleeding. INR at goal. D/C Lovenox. Stay on Coumadin 2.5 mg/day; 3 mg on Wednesdays.  I gave pt 2 samples of Coumadin 1 mg.  She will use them the next 2 Wednesdays.  If her INR is at goal next visit (09/20/12) we will need to prescribe her some 1 mg strength Warfarin. Ebony Hail, Pharm.D., CPP 09/09/2012@3 :58 PM

## 2012-09-20 ENCOUNTER — Ambulatory Visit (HOSPITAL_BASED_OUTPATIENT_CLINIC_OR_DEPARTMENT_OTHER): Payer: BC Managed Care – PPO | Admitting: Pharmacist

## 2012-09-20 ENCOUNTER — Other Ambulatory Visit (HOSPITAL_BASED_OUTPATIENT_CLINIC_OR_DEPARTMENT_OTHER): Payer: BC Managed Care – PPO | Admitting: Lab

## 2012-09-20 DIAGNOSIS — I81 Portal vein thrombosis: Secondary | ICD-10-CM

## 2012-09-20 LAB — PROTIME-INR
INR: 2.4 (ref 2.00–3.50)
Protime: 28.8 Seconds — ABNORMAL HIGH (ref 10.6–13.4)

## 2012-09-20 LAB — POCT INR: INR: 2.4

## 2012-09-20 NOTE — Patient Instructions (Addendum)
INR at goal.  No changes  Continue same dose of 2.5 mg daily except for 3 mg on Wednesdays   Return 10/11/12 at 2pm

## 2012-09-20 NOTE — Progress Notes (Signed)
INR is at goal and patient appears to be on a stable dose. No missed doses or bruising/bleeding noted. Patient is doing well with no complaints except for a "knot" in her stomach from the lovenox injections. These injections have since been stopped and this should improve with time. Plan is to continue the same dose of 2.5 mg daily except for 3 mg on Wednesday. Pt to return in 3 weeks on 10/11/12 at 2pm for lab and 2:15 for coumadin clinic

## 2012-10-11 ENCOUNTER — Other Ambulatory Visit (HOSPITAL_BASED_OUTPATIENT_CLINIC_OR_DEPARTMENT_OTHER): Payer: BC Managed Care – PPO | Admitting: Lab

## 2012-10-11 ENCOUNTER — Ambulatory Visit (HOSPITAL_BASED_OUTPATIENT_CLINIC_OR_DEPARTMENT_OTHER): Payer: BC Managed Care – PPO | Admitting: Pharmacist

## 2012-10-11 DIAGNOSIS — I81 Portal vein thrombosis: Secondary | ICD-10-CM

## 2012-10-11 LAB — PROTIME-INR
INR: 2.8 (ref 2.00–3.50)
Protime: 33.6 Seconds — ABNORMAL HIGH (ref 10.6–13.4)

## 2012-10-11 LAB — POCT INR: INR: 2.8

## 2012-10-11 NOTE — Progress Notes (Addendum)
INR 2.8 on 2.5 mg daily with 3 mg on Wed Continue same dose and RTC in 4 weeks. No changes to report with meds or diet.  Gave pt #10 1mg  coumadin samples LOT: [id number]  Exp 2/15

## 2012-10-11 NOTE — Patient Instructions (Signed)
Continue taking coumadin 2.5 mg daily except 3mg  on Wednesday.  Recheck INR on 11/08/12; lab at 9 am and coumadin clinic at 9:15 am.

## 2012-11-08 ENCOUNTER — Ambulatory Visit (HOSPITAL_BASED_OUTPATIENT_CLINIC_OR_DEPARTMENT_OTHER): Payer: BC Managed Care – PPO | Admitting: Pharmacist

## 2012-11-08 ENCOUNTER — Other Ambulatory Visit (HOSPITAL_BASED_OUTPATIENT_CLINIC_OR_DEPARTMENT_OTHER): Payer: BC Managed Care – PPO | Admitting: Lab

## 2012-11-08 DIAGNOSIS — I81 Portal vein thrombosis: Secondary | ICD-10-CM

## 2012-11-08 LAB — PROTIME-INR
INR: 2.7 (ref 2.00–3.50)
Protime: 32.4 Seconds — ABNORMAL HIGH (ref 10.6–13.4)

## 2012-11-08 LAB — POCT INR: INR: 2.7

## 2012-11-08 NOTE — Progress Notes (Signed)
INR at goal. No issues noted with bleeding or bruising. No medication changes and no missed doses. Patient is stable on 2.5 mg daily except for 3 mg on Wednesday. Plan is to Continue taking coumadin 2.5 mg daily except 3mg  on Wednesday.  Recheck INR on 11/08/12; lab at 12pm (noon) and coumadin clinic at 12:15 pm. And MD appointment with Dr. Cyndie Chime at 12:30

## 2012-11-08 NOTE — Patient Instructions (Addendum)
INR at goal  No changes  Continue taking coumadin 2.5 mg daily except 3mg  on Wednesday.  Recheck INR on 11/08/12; lab at 12pm (noon) and coumadin clinic at 12:15 pm. And MD appointment with Dr. Reece Agar at 12:30

## 2012-12-09 ENCOUNTER — Ambulatory Visit (HOSPITAL_BASED_OUTPATIENT_CLINIC_OR_DEPARTMENT_OTHER): Payer: BC Managed Care – PPO | Admitting: Oncology

## 2012-12-09 ENCOUNTER — Ambulatory Visit: Payer: BC Managed Care – PPO | Admitting: Pharmacist

## 2012-12-09 ENCOUNTER — Other Ambulatory Visit (HOSPITAL_BASED_OUTPATIENT_CLINIC_OR_DEPARTMENT_OTHER): Payer: BC Managed Care – PPO | Admitting: Lab

## 2012-12-09 VITALS — BP 108/73 | HR 57 | Temp 97.6°F | Resp 20 | Ht 62.0 in | Wt 119.3 lb

## 2012-12-09 DIAGNOSIS — R1031 Right lower quadrant pain: Secondary | ICD-10-CM

## 2012-12-09 DIAGNOSIS — I81 Portal vein thrombosis: Secondary | ICD-10-CM

## 2012-12-09 LAB — PROTIME-INR
INR: 2.2 (ref 2.00–3.50)
Protime: 26.4 Seconds — ABNORMAL HIGH (ref 10.6–13.4)

## 2012-12-09 LAB — POCT INR: INR: 2.2

## 2012-12-09 NOTE — Progress Notes (Signed)
Hematology and Oncology Follow Up Visit  Kathy Wu [id number] Jan 04, 1976 37 y.o. 12/09/2012 7:26 PM   Principle Diagnosis: Encounter Diagnoses  Name Primary?  . Portal vein thrombosis Yes  . RLQ abdominal pain      Interim History:   Followup visit for this 37 year old woman who was found to have an age indeterminate portal vein thrombosis during a February 9th 2014  hospitalization when she presented with atypical neurologic symptoms and persistent but chronic right lower quadrant abdominal pain for which she had a recent laparoscopic exploratory procedure on 07/19/2012.  It was not clear whether the portal vein thrombosis was acute.  vascular duplex Doppler study showed the main portal veins were patent. There was nonocclusive thrombus in a peripheral intrahepatic right portal vein branch. Hepatic and splenic veins were patent. It did not appear that her chronic right lower  abdominal symptoms were related to the portal vein thrombosis. Her only apparent risk factor for portal vein thrombosis was a cholecystectomy done 2 years previously. A hypercoagulation profile showed a borderline decrease in protein S activity of 53% (40-981). There was a family history of DVT in her father and a sister who had a DVT after her leg trauma.. Father was supposed to get tested. He does not live in [place]. Not clear whether this testing was ever done according to the patient. She has 2 young daughters and I felt that they were too young to be tested.  She has been stable on Coumadin. She has had a recent flareup of intermittent achy right-sided abdominal pain. INR today remains therapeutic at 2.2.   Medications: reviewed  Allergies: No Known Allergies  Review of Systems: See history of present illness Remaining ROS negative.  Physical Exam: Blood pressure 108/73, pulse 57, temperature 97.6 F (36.4 C), temperature source Oral, resp. rate 20, height 5\' 2"  (1.575 m), weight 119 lb 4.8 oz  (54.114 kg). Wt Readings from Last 3 Encounters:  12/09/12 119 lb 4.8 oz (54.114 kg)  08/16/12 113 lb 1.6 oz (51.302 kg)  07/22/12 112 lb (50.803 kg)     General appearance: Well-nourished Caucasian woman HENNT: Pharynx no erythema or exudate Lymph nodes: No adenopathy Breasts: Lungs: Clear to auscultation resonant to percussion Heart: Regular rhythm no murmur Abdomen: Soft, nontender, no mass, no organomegaly Extremities: No edema, no calf tenderness Musculoskeletal: No joint deformities GU: Vascular: No cyanosis Neurologic: No focal deficit Skin: No rash or ecchymosis  Lab Results: Lab Results  Component Value Date   WBC 5.3 08/16/2012   HGB 12.8 08/16/2012   HCT 38.5 08/16/2012   MCV 88.1 08/16/2012   PLT 150 08/16/2012     Chemistry      Component Value Date/Time   NA 140 07/21/2012 2217   K 3.7 07/21/2012 2217   CL 102 07/21/2012 2217   CO2 28 07/21/2012 2217   BUN 13 07/21/2012 2217   CREATININE 0.65 07/21/2012 2217      Component Value Date/Time   CALCIUM 9.1 07/21/2012 2217   ALKPHOS 30* 07/21/2012 2217   AST 15 07/21/2012 2217   ALT 8 07/21/2012 2217   BILITOT 0.5 07/21/2012 2217      Impression: Portal vein thrombosis subacute versus chronic I told her to continue anticoagulation through mid August. I will then stop her Coumadin. Start one 81 mg aspirin daily. Weight for 3 weeks and then repeat protein S studies. We will then make a decision on whether or not to extend anticoagulation.    CC:. Dr. Gerlene Burdock  Edwena Blow, MD 6/30/20147:26 PM

## 2012-12-09 NOTE — Patient Instructions (Addendum)
Continue taking coumadin 2.5mg  daily except 3mg  on Wednesdays.  Take your last dose of coumadin on 01/26/13 as instructed by Dr. Cyndie Chime.  Repeat labs will be done on 02/17/13.  MD will then make decision on further Coumadin treatment based on lab results. We have enjoyed seeing you in the clinic.

## 2012-12-09 NOTE — Progress Notes (Addendum)
INR within goal today. No problems to report. No changes in medications, diet, etc. No missed doses of coumadin. Continue taking coumadin 2.5mg  daily except 3mg  on Wednesdays.  Take your last dose of coumadin on 01/26/13 as instructed by Dr. Cyndie Chime.  Repeat labs will be done on 02/17/13.  MD will then make decision on further Coumadin treatment based on lab results.  Coumadin 1mg  tablets provided: (x 5 tablets) Lot:  [id number], Exp: 2/15

## 2012-12-10 ENCOUNTER — Telehealth: Payer: Self-pay | Admitting: Oncology

## 2012-12-10 NOTE — Telephone Encounter (Signed)
Talked to pt and gave her appt for lab before MD on September 2014

## 2012-12-31 ENCOUNTER — Other Ambulatory Visit: Payer: Self-pay | Admitting: *Deleted

## 2012-12-31 DIAGNOSIS — I81 Portal vein thrombosis: Secondary | ICD-10-CM

## 2012-12-31 MED ORDER — WARFARIN SODIUM 5 MG PO TABS: 2.5000 mg | ORAL_TABLET | Freq: Every day | ORAL | Status: DC

## 2012-12-31 NOTE — Telephone Encounter (Signed)
Patient called.  She needs just enough coumadin to get her until 01/26/13.  She takes 1/2 of a 5mg  tablet.  Will order #[id number]

## 2013-02-05 ENCOUNTER — Encounter: Payer: Self-pay | Admitting: Oncology

## 2013-02-05 ENCOUNTER — Telehealth: Payer: Self-pay | Admitting: *Deleted

## 2013-02-05 NOTE — Telephone Encounter (Signed)
Patient called wondering if it was ok to get a tattoo since she was off coumadin.  Let her know that we could not tell her it was ok to get a tattoo in view of all the risks from needle exposure, including but not limited to hepatitis and HIV. Patient said it was probably a stupid question.  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

## 2013-02-17 ENCOUNTER — Other Ambulatory Visit: Payer: BC Managed Care – PPO

## 2013-02-24 ENCOUNTER — Other Ambulatory Visit: Payer: Self-pay | Admitting: Oncology

## 2013-02-25 ENCOUNTER — Telehealth: Payer: Self-pay | Admitting: *Deleted

## 2013-02-25 NOTE — Telephone Encounter (Signed)
Patient called to confirm she is no longer on warfarin and that she had not requested the refill.

## 2013-02-25 NOTE — Addendum Note (Signed)
Addended by: Wandalee Ferdinand on: 02/25/2013 12:03 PM   Modules accepted: Orders, Medications

## 2013-02-25 NOTE — Telephone Encounter (Signed)
Left VM for patient to call regarding her warfarin. Is she taking it? Was to take her last dose on 01/26/13 with follow up labs on 02/17/13.

## 2013-03-05 ENCOUNTER — Other Ambulatory Visit (HOSPITAL_BASED_OUTPATIENT_CLINIC_OR_DEPARTMENT_OTHER): Payer: BC Managed Care – PPO

## 2013-03-05 DIAGNOSIS — I81 Portal vein thrombosis: Secondary | ICD-10-CM

## 2013-03-05 DIAGNOSIS — R1031 Right lower quadrant pain: Secondary | ICD-10-CM

## 2013-03-07 LAB — PROTEIN S, TOTAL: Protein S Total: 68 % (ref 60–150)

## 2013-03-07 LAB — PROTEIN S ACTIVITY: Protein S Activity: 65 % — ABNORMAL LOW (ref 69–129)

## 2013-03-07 LAB — PROTEIN S, ANTIGEN, FREE: Protein S Ag, Free: 59 % normal (ref 50–147)

## 2013-03-11 ENCOUNTER — Other Ambulatory Visit: Payer: Self-pay | Admitting: Oncology

## 2013-03-11 ENCOUNTER — Telehealth: Payer: Self-pay | Admitting: Oncology

## 2013-03-11 ENCOUNTER — Ambulatory Visit (HOSPITAL_COMMUNITY)
Admission: RE | Admit: 2013-03-11 | Discharge: 2013-03-11 | Disposition: A | Discharge status: Home / Self Care | Payer: BC Managed Care – PPO | Source: Ambulatory Visit | Attending: Oncology | Admitting: Oncology

## 2013-03-11 ENCOUNTER — Ambulatory Visit (HOSPITAL_BASED_OUTPATIENT_CLINIC_OR_DEPARTMENT_OTHER): Payer: BC Managed Care – PPO | Admitting: Oncology

## 2013-03-11 ENCOUNTER — Encounter (HOSPITAL_COMMUNITY): Payer: Self-pay

## 2013-03-11 VITALS — BP 114/80 | HR 65 | Temp 97.3°F | Resp 19 | Ht 62.0 in | Wt 121.4 lb

## 2013-03-11 DIAGNOSIS — R1031 Right lower quadrant pain: Secondary | ICD-10-CM

## 2013-03-11 DIAGNOSIS — I81 Portal vein thrombosis: Secondary | ICD-10-CM

## 2013-03-11 DIAGNOSIS — I998 Other disorder of circulatory system: Secondary | ICD-10-CM | POA: Insufficient documentation

## 2013-03-11 MED ORDER — IOHEXOL 350 MG/ML SOLN
100.0000 mL | Freq: Once | INTRAVENOUS | Status: AC | PRN
  Administered 2013-03-11: 100 mL via INTRAVENOUS

## 2013-03-11 NOTE — Telephone Encounter (Signed)
sched pt for U/s at [facility]....ok and aware

## 2013-03-11 NOTE — Telephone Encounter (Signed)
Per desk nurse after speaking w/Dr. Ricarda Frame added ct to be done today after [place]. S/w Alicia in central and Ct added for 4pm. S/w pt she is aware. Order to [facility] for preauth and [facility] made aware pt sent over for test.

## 2013-03-11 NOTE — Progress Notes (Signed)
pia

## 2013-03-12 ENCOUNTER — Telehealth: Payer: Self-pay | Admitting: Pharmacist

## 2013-03-12 ENCOUNTER — Other Ambulatory Visit (HOSPITAL_COMMUNITY): Payer: BC Managed Care – PPO

## 2013-03-12 NOTE — Telephone Encounter (Signed)
Called patient LVM to schedule cc appmt for Mon 10/6 per Dr. Reece Agar request. He saw patient 9/30 and restarted her on coumadin. See MD encounter note from 9/30.

## 2013-03-12 NOTE — Progress Notes (Signed)
Hematology and Oncology Follow Up Visit  Kathy Wu [id number] [birth year] 37 y.o. 03/12/2013 1:57 PM   Principle Diagnosis: Encounter Diagnoses  Name Primary?  . Portal vein thrombosis Yes  . RLQ abdominal pain      Interim History:   37 year old woman I initially saw her in consultation when she was hospitalized back in February one week after a laparoscopic exploration to evaluate persistent right lower quadrant  abdominal pain to exclude ruptured ovarian cysts. CT scan of the abdomen and pelvis done a few days after this procedure to evaluate vaginal bleeding, unexpectedly showed evidence for portal vein thrombosis. There was already evidence for recanalization so this was likely a subacute event. I did recommend anticoagulation. She was started on a standard Lovenox/Coumadin regimen. Special hematology labs drawn prior to starting Coumadin showed a borderline decrease of total protein S and a moderate decrease in functional protein S with concomitant normal protein C levels. No other abnormalities were found. There was no strong family history of clotting but her father did have a lower extremity DVT apparently unprovoked and a sister had a DVT following leg trauma. I elected to keep her on anticoagulation for 6 months and then reevaluate. She was advised to stop her Coumadin in mid August. Protein S levels were then repeated on September 24 and remain borderline decreased with free protein S antigen 59% of control (50-147) and functional protein S activity 65% (40-981). She now reports that since stopping the Coumadin she has had a recurrence of right lower quadrant abdominal pain which is worse at night when she lies down. Still present when she gets up and takes a few hours before it subsides. Similar in quality to the pain that she was having back in February.   I sent her for urgent studies today to exclude the possibility of recurrent thrombosis. These are detailed in the reports below.  I spoke with the radiologist who did both of the studies. The portal veins have completely recanalized both by CT and Doppler criteria. The only abnormality appears to be significant dilation of the ovarian veins without any sign of ovarian vein thrombosis. Although these could be findings associated with pelvic congestion syndrome, when I reported the clinical history to the radiologist he felt that this was likely not the case. The symptoms usually are better in the supine position and get worse during the course of the day not better.         Medications: reviewed  Allergies: No Known Allergies  Review of Systems: Constitutional:   No anorexia, weight loss, fevers HEENT no sore throat Respiratory: No cough or dyspnea Cardiovascular:  No chest pain or palpitations Gastrointestinal: See above Genito-Urinary: Periods regular Musculoskeletal: No muscle bone or joint pain Neurologic: No headache or change in vision Skin: No rash or ecchymosis  Remaining ROS negative.     Physical Exam: Blood pressure 114/80, pulse 65, temperature 97.3 F (36.3 C), temperature source Oral, resp. rate 19, height 5\' 2"  (1.575 m), weight 121 lb 6.4 oz (55.067 kg), last menstrual period 02/25/2013. Wt Readings from Last 3 Encounters:  03/11/13 121 lb 6.4 oz (55.067 kg)  12/09/12 119 lb 4.8 oz (54.114 kg)  08/16/12 113 lb 1.6 oz (51.302 kg)     General appearance: Well-nourished Caucasian woman HENNT: Pharynx no erythema or exudate. No thyromegaly. Lymph nodes: No cervical, supraclavicular, or axillary adenopathy  Breasts: Lungs: Clear to auscultation resonant to percussion Heart: Regular rhythm no murmur Abdomen: Soft, currently nontender, no mass, no  organomegaly, normal bowel sounds. Extremities: No edema, no calf tenderness Musculoskeletal: No joint deformities GU: Vascular: No carotid bruits, no cyanosis Neurologic: Mental status intact, cranial nerves grossly normal, motor strength 5  over 5, reflexes 1+ symmetric Skin: No rash or ecchymosis  Lab Results: CBC W/Diff    Component Value Date/Time   WBC 5.3 08/16/2012 1537   WBC 4.4 07/25/2012 0450   RBC 4.37 08/16/2012 1537   RBC 4.10 07/25/2012 0450   HGB 12.8 08/16/2012 1537   HGB 12.1 07/25/2012 0450   HCT 38.5 08/16/2012 1537   HCT 36.6 07/25/2012 0450   PLT 150 08/16/2012 1537   PLT 151 07/25/2012 0450   MCV 88.1 08/16/2012 1537   MCV 89.3 07/25/2012 0450   MCH 29.3 08/16/2012 1537   MCH 29.5 07/25/2012 0450   MCHC 33.3 08/16/2012 1537   MCHC 33.1 07/25/2012 0450   RDW 12.8 08/16/2012 1537   RDW 12.4 07/25/2012 0450   LYMPHSABS 1.6 08/16/2012 1537   LYMPHSABS 1.7 07/21/2012 2217   MONOABS 0.4 08/16/2012 1537   MONOABS 0.5 07/21/2012 2217   EOSABS 0.3 08/16/2012 1537   EOSABS 0.1 07/21/2012 2217   BASOSABS 0.1 08/16/2012 1537   BASOSABS 0.0 07/21/2012 2217     Chemistry      Component Value Date/Time   NA 140 07/21/2012 2217   K 3.7 07/21/2012 2217   CL 102 07/21/2012 2217   CO2 28 07/21/2012 2217   BUN 13 07/21/2012 2217   CREATININE 0.65 07/21/2012 2217      Component Value Date/Time   CALCIUM 9.1 07/21/2012 2217   ALKPHOS 30* 07/21/2012 2217   AST 15 07/21/2012 2217   ALT 8 07/21/2012 2217   BILITOT 0.5 07/21/2012 2217       Radiological Studies: [place] Art/ven Flow Abd Pelv Doppler  03/11/2013   *RADIOLOGY REPORT*  Clinical Data: Evaluate for recurrent portal vein thrombosis  DUPLEX ULTRASOUND OF LIVER  Technique:  Color and duplex Doppler ultrasound was performed to evaluate the hepatic in-flow and out-flow vessels.  Comparison:  Most recent prior ultrasound Doppler liver study 07/24/2012  Portal Vein Velocities: Main:      35 cm/sec Right:      31 cm/sec Left:        27 cm/sec  Hepatic Vein Velocities: Right:     35 cm/sec Middle:  33 cm/sec Left:       59 cm/sec  Hepatic Artery Velocity:   76 cm/sec Splenic Vein Velocity:      33 cm/sec  Varices:   None visualized Ascites:   None  Findings: Main, left and right portal veins are patent with normal  hepatopetal flow.  The spleen is within normal limits for size at 6.7 x 8.3 x 4.5 cm.  IMPRESSION:  Main, left, right and visualized intrahepatic portal venous branches are patent with normal hepatopetal flow.   Original Report Authenticated By: Malachy Moan, M.D.   Ct Angio Abd/pel W/ And/or W/o  03/11/2013   *RADIOLOGY REPORT*  Clinical Data:   Right lower quadrant abdominal pain, history of prior portal vein thrombosis.  Recently stopped taking Coumadin.  CT ABDOMEN AND PELVIS WITH CONTRAST  Technique:  Multidetector CT imaging of the abdomen and pelvis was performed using the standard protocol following bolus administration of intravenous contrast.  Contrast: OMNIPAQUE IOHEXOL 350 MG/ML SOLN  Comparison:  Prior CT abdomen/pelvis 07/22/2012; duplex liver ultrasound study performed earlier today at 14:49 p.m.  Findings:  Lower Chest:  The lung bases are  clear.  Visualized cardiac structures within normal limits for size.  No pericardial effusion. Unremarkable visualized distal thoracic esophagus.  Abdomen: Unremarkable CT appearance of the stomach, duodenum, spleen, adrenal glands, pancreas and liver.  No evidence of portal venous thrombosis.  There has been interval recanalization of the anterior segmental branches of the right portal vein which were noted to be thrombosed on 07/22/2012.  The hepatic veins are well seen and are patent.  Surgical changes of prior cholecystectomy. No intra or extrahepatic biliary ductal dilatation.  Symmetric renal parenchymal enhancement bilaterally.  No hydronephrosis.  Sub centimeter well-defined hypoattenuating lesion in the interpolar left kidney is too small for accurate characterization but statistically likely represents a benign cortical cyst.  The renal veins are patent bilaterally. Incidentally, the left renal vein is retroaortic.  The splenic vein and superior mesenteric vein are patent.  Normal-caliber large and small bowel throughout the abdomen.  No  evidence of bowel obstruction.  Redundant sigmoid colon.  Normal appendix in the right lower quadrant.  No free fluid.  Pelvis: The bladder is largely decompressed.  Small volume of free fluid in the pelvic cul-de-sac is not unremarkable in a reproductive age female.  Small left corpus luteal cyst associated with the ovary. Borderline enlarged, left greater than right ovarian veins with prominence of parametrial veins.  The left ovarian vein measures up to 9 mm in caliber.  Bones: No acute fracture or aggressive appearing lytic or blastic osseous lesion.  IMPRESSION:  1.  No evidence of recurrent portal venous thrombosis.  In fact, there is been interval recanalization of the anterior segmental branches of the right portal vein which were thrombosed on the CT scan from 07/22/2012.  2.  No acute abnormality in the abdomen or pelvis to explain the patient's clinical symptoms.  The appendix is normal.  3.  Moderate left greater than right enlargement of the ovarian veins and parametrial venous plexus.  While nonspecific, similar findings can be seen with pelvic congestion syndrome.  Recommend clinical correlation for signs and symptoms (pelvic pain/heaviness worse at the end the day or after long periods of standing, post coital ache).   Original Report Authenticated By: Malachy Moan, M.D.    Impression:  Idiopathic subacute portal vein thrombosis February 2014 Complete radiographic resolution with recanalization of affected veins.  Protein S. levels are borderline low not in the range that one sees with true protein S deficiency. Just based on these lab values and  a rather weak family history, I was ready to stop full dose Coumadin anticoagulation and just put her on a single aspirin a day. Unfortunately, recurrent right lower quadrant abdominal symptoms recurred coincident with stopping Coumadin. There is no gross evidence for  pelvic or ovarian vein thrombosis based on the studies above. I don't have  the expertise to comment on whether or not dilated ovarian veins could be causing her symptoms.  Plan: I'm going to put her back on Coumadin temporarily to see if this impacts on her symptoms until she can be reevaluated by her gynecologist. We will do Coumadin monitoring through my office. She will start back at the previous therapeutic dose 2.5 mg daily except 3 mg Wednesday and we will check a pro time on October 6.     CC:. Dr. Richarda Overlie;   Levert Feinstein, MD 10/1/20141:57 PM

## 2013-03-17 ENCOUNTER — Other Ambulatory Visit (HOSPITAL_BASED_OUTPATIENT_CLINIC_OR_DEPARTMENT_OTHER): Payer: BC Managed Care – PPO | Admitting: Lab

## 2013-03-17 ENCOUNTER — Ambulatory Visit (HOSPITAL_BASED_OUTPATIENT_CLINIC_OR_DEPARTMENT_OTHER): Payer: BC Managed Care – PPO | Admitting: Pharmacist

## 2013-03-17 DIAGNOSIS — I81 Portal vein thrombosis: Secondary | ICD-10-CM

## 2013-03-17 LAB — PROTIME-INR
INR: 1.7 — ABNORMAL LOW (ref 2.00–3.50)
Protime: 20.4 Seconds — ABNORMAL HIGH (ref 10.6–13.4)

## 2013-03-17 LAB — POCT INR: INR: 1.7

## 2013-03-17 NOTE — Progress Notes (Addendum)
INR = 1.7 after restarting Coumadin on 03/11/13 at her previous maintenance dose of 2.5 mg/day except 3 mg on Wednesdays Pt has no abdominal pain since she restarted Coumadin. She has appt w/ GYN on 10/20 or 10/21 (pt couldn't remember which day).  She did switch GYN MD. INR low but has only been back on Coumadin for 6 days. Continue on usual dose & repeat INR on 10/23 (after her GYN appt; maybe we'll have recommendation from them on continuing Coumadin or not). Ebony Hail, Pharm.D., CPP 03/17/2013@9 :51 AM   Samples Coumadin 2.5 mg x 21 tabs (Lot # [id number]; exp 04/11/13) Coumadin 3 mg x 10 tabs (Lot # [id number]; exp 11/31/14)

## 2013-04-02 ENCOUNTER — Telehealth: Payer: Self-pay | Admitting: Pharmacist

## 2013-04-02 NOTE — Telephone Encounter (Signed)
Pt called pharmacy to inform us that her GYN appt changed to 04/23/13 so she requested to be seen after she goes to GYN (hopefully will get some recommendation from them re: anticoag.) Pt has had no further abdominal pain. We've moved her lab/CC appt to 04/24/13 at 8 am/8:15 am. If pt needs more Coumadin samples, she will call us & we can leave some at front desk for her. Ebony Hail, Pharm.D., CPP 04/02/2013@2 :53 PM

## 2013-04-03 ENCOUNTER — Ambulatory Visit: Payer: BC Managed Care – PPO

## 2013-04-03 ENCOUNTER — Other Ambulatory Visit: Payer: BC Managed Care – PPO | Admitting: Lab

## 2013-04-24 ENCOUNTER — Telehealth: Payer: Self-pay | Admitting: Pharmacist

## 2013-04-24 ENCOUNTER — Ambulatory Visit: Payer: BC Managed Care – PPO

## 2013-04-24 ENCOUNTER — Other Ambulatory Visit: Payer: BC Managed Care – PPO | Admitting: Lab

## 2013-04-24 NOTE — Telephone Encounter (Signed)
Patient FTKA this morning for lab and coumadin clinic. Called and left VM for patient to call back to reschedule.   Christell Faith, PharmD

## 2013-04-25 ENCOUNTER — Other Ambulatory Visit: Payer: Self-pay | Admitting: Obstetrics and Gynecology

## 2013-04-28 ENCOUNTER — Ambulatory Visit (HOSPITAL_BASED_OUTPATIENT_CLINIC_OR_DEPARTMENT_OTHER): Payer: BC Managed Care – PPO | Admitting: Pharmacist

## 2013-04-28 ENCOUNTER — Other Ambulatory Visit (HOSPITAL_BASED_OUTPATIENT_CLINIC_OR_DEPARTMENT_OTHER): Payer: BC Managed Care – PPO | Admitting: Lab

## 2013-04-28 DIAGNOSIS — I81 Portal vein thrombosis: Secondary | ICD-10-CM

## 2013-04-28 LAB — PROTIME-INR
INR: 1.2 — ABNORMAL LOW (ref 2.00–3.50)
Protime: 14.4 Seconds — ABNORMAL HIGH (ref 10.6–13.4)

## 2013-04-28 LAB — POCT INR: INR: 1.2

## 2013-04-28 NOTE — Patient Instructions (Signed)
Per Gyn - OK to stop coumadin Aspirin 1 tablet daily only now F/U with Dr. Cyndie Chime if needed

## 2013-04-28 NOTE — Progress Notes (Signed)
INR below goal today Pt saw her gynecologist last week At this appointment it was decided she could stop her coumadin and continued full dose anticoagulation was no longer warranted Her Gynecologist is not concerned that her recurrent right lower quadrant abdominal symptoms are related to stopping her coumadin Pt reports no issues regarding bleeding/bruising or symptoms of clotting Pt has been taking her coumadin until today but has not been monitoring her vitamin k intake in her diet Plan: Stop coumadin  Pt will be discharged from our clinic Follow up with MD as needed

## 2013-08-04 HISTORY — PX: VAGINA SURGERY: SHX829

## 2013-08-10 ENCOUNTER — Other Ambulatory Visit: Payer: Self-pay | Admitting: Oncology

## 2013-08-10 DIAGNOSIS — I81 Portal vein thrombosis: Secondary | ICD-10-CM

## 2013-08-10 DIAGNOSIS — R1031 Right lower quadrant pain: Secondary | ICD-10-CM

## 2013-08-11 ENCOUNTER — Encounter: Payer: Self-pay | Admitting: Oncology

## 2013-08-11 ENCOUNTER — Telehealth: Payer: Self-pay | Admitting: Oncology

## 2013-08-11 ENCOUNTER — Other Ambulatory Visit: Payer: Self-pay | Admitting: *Deleted

## 2013-08-11 NOTE — Telephone Encounter (Signed)
S/w the pt and she is aware of her appt on 08/13/2013@4 :00pm

## 2013-08-13 ENCOUNTER — Other Ambulatory Visit (HOSPITAL_BASED_OUTPATIENT_CLINIC_OR_DEPARTMENT_OTHER): Payer: BC Managed Care – PPO

## 2013-08-13 ENCOUNTER — Ambulatory Visit (HOSPITAL_BASED_OUTPATIENT_CLINIC_OR_DEPARTMENT_OTHER): Payer: BC Managed Care – PPO | Admitting: Oncology

## 2013-08-13 VITALS — BP 120/83 | HR 68 | Temp 98.3°F | Resp 18 | Ht 62.0 in | Wt 118.7 lb

## 2013-08-13 DIAGNOSIS — I81 Portal vein thrombosis: Secondary | ICD-10-CM

## 2013-08-13 DIAGNOSIS — R1031 Right lower quadrant pain: Secondary | ICD-10-CM

## 2013-08-13 LAB — PROTIME-INR
INR: 1 — ABNORMAL LOW (ref 2.00–3.50)
Protime: 12 Seconds (ref 10.6–13.4)

## 2013-08-13 LAB — CBC WITH DIFFERENTIAL/PLATELET
BASO%: 1.1 % (ref 0.0–2.0)
Basophils Absolute: 0.1 10*3/uL (ref 0.0–0.1)
EOS%: 2.8 % (ref 0.0–7.0)
Eosinophils Absolute: 0.2 10*3/uL (ref 0.0–0.5)
HCT: 39.6 % (ref 34.8–46.6)
HGB: 12.6 g/dL (ref 11.6–15.9)
LYMPH%: 29.8 % (ref 14.0–49.7)
MCH: 28.4 pg (ref 25.1–34.0)
MCHC: 31.8 g/dL (ref 31.5–36.0)
MCV: 89.1 fL (ref 79.5–101.0)
MONO#: 0.5 10*3/uL (ref 0.1–0.9)
MONO%: 8.2 % (ref 0.0–14.0)
NEUT#: 3.4 10*3/uL (ref 1.5–6.5)
NEUT%: 58.1 % (ref 38.4–76.8)
Platelets: 148 10*3/uL (ref 145–400)
RBC: 4.44 10*6/uL (ref 3.70–5.45)
RDW: 13 % (ref 11.2–14.5)
WBC: 5.9 10*3/uL (ref 3.9–10.3)
lymph#: 1.8 10*3/uL (ref 0.9–3.3)

## 2013-08-13 NOTE — Progress Notes (Signed)
Hematology and Oncology Follow Up Visit  Kathy Wu [id number] March 15, 1976 38 y.o. 08/13/2013 7:19 PM   Principle Diagnosis: Encounter Diagnosis  Name Primary?  . 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 CT scan of the abdomen and pelvis done a few days after this procedure to evaluate vaginal bleeding, unexpectedly showed evidence for portal vein thrombosis. There was already evidence for recanalization so this was likely a subacute event. I did recommend anticoagulation. She was started on a standard Lovenox/Coumadin regimen. Special hematology labs drawn prior to starting Coumadin showed a borderline decrease of total protein S and a moderate decrease in functional protein S with concomitant normal protein C levels. No other abnormalities were found.  There was no strong family history of clotting but her father did have a lower extremity DVT apparently unprovoked and a sister had a DVT following leg trauma. I elected to keep her on anticoagulation for 6 months and then reevaluate. She was advised to stop her Coumadin in mid August. Protein S levels were then repeated on September 24 and were now low normal with free protein S antigen 59% of control (50-147) and functional protein S activity 65% (78-295).  At time of a followup visit here on 03/12/2013, she reported that since stopping the Coumadin she has had a recurrence of right lower quadrant abdominal pain which was worse at night when she lyed down. Still present when she got up and takes a few hours before it subsides. Similar in quality to the pain that she was having back in February.  I sent her for urgent studies  to exclude the possibility of  recurrent thrombosis . The portal veins had completely recanalized both by CT and Doppler criteria. The only abnormality appeared to be significant dilation of the ovarian veins without any sign of ovarian vein thrombosis. Although these could be findings associated with pelvic congestion syndrome, when I reported the clinical history to the radiologist he felt that this was likely not the case. The symptoms usually are better in the supine position and get worse during the course of the day not better.  I did not resume anticoagulation.  She continues to have atypical right lower quadrant abdominal pain. She was recently evaluated by Dr. Malva Limes, OB/GYN, who referred her to a specialist in pelvic pain syndromes at [facility], Dr. Helene Kelp. She had an extensive evaluation including vaginal biopsies when vaginal ulcerations were found. She was just started on a physical therapy program; too early to assess response.  She denies any dyspnea, chest pain, palpitations, leg swelling or pain. She continues to work full-time and raising her child who is now 57 years old.   Medications: reviewed  Allergies: No Known Allergies  Review of Systems: Hematology:  No bleeding or bruising ENT ROS: No sore throat Breast ROS:  Respiratory ROS: No cough or dyspnea Cardiovascular ROS: No chest pain or palpitations  Gastrointestinal ROS: Chronic right lower quadrant abdominal pain  -see above Genito-Urinary ROS: See above  Musculoskeletal ROS: No muscle bone or joint pain Neurological ROS: No headache or change in vision. No recurrent paresthesias. Dermatological ROS: No rash or ecchymosis Remaining ROS negative:   Physical Exam: Blood pressure 120/83, pulse  68, temperature 98.3 F (36.8 C), temperature source Oral, resp. rate 18, height 5\' 2"  (1.575 m), weight 118 lb 11.2 oz (53.842 kg), SpO2 100.00%. Wt Readings from Last 3 Encounters:  08/13/13 118 lb 11.2 oz (53.842 kg)   03/11/13 121 lb 6.4 oz (55.067 kg)  12/09/12 119 lb 4.8 oz (54.114 kg)     General appearance: Thin, well-nourished Caucasian woman HENNT: Pharynx no erythema, exudate, mass, or ulcer. No thyromegaly or thyroid nodules Lymph nodes: No cervical, supraclavicular, or axillary lymphadenopathy Breasts:  Lungs: Clear to auscultation, resonant to percussion throughout Heart: Regular rhythm, no murmur, no gallop, no rub, no click, no edema Abdomen: Soft, tender right lower quadrant, normal bowel sounds, no mass, no organomegaly Extremities: No edema, no calf tenderness Musculoskeletal: no joint deformities GU:  Vascular: Carotid pulses 2+, no bruits,  Neurologic: Alert, oriented, PERRLA,   cranial nerves grossly normal, motor strength 5 over 5, reflexes 1+ symmetric, upper body coordination normal, gait normal, Skin: No rash or ecchymosis  Lab Results: CBC W/Diff    Component Value Date/Time   WBC 5.9 08/13/2013 1607   WBC 4.4 07/25/2012 0450   RBC 4.44 08/13/2013 1607   RBC 4.10 07/25/2012 0450   HGB 12.6 08/13/2013 1607   HGB 12.1 07/25/2012 0450   HCT 39.6 08/13/2013 1607   HCT 36.6 07/25/2012 0450   PLT 148 08/13/2013 1607   PLT 151 07/25/2012 0450   MCV 89.1 08/13/2013 1607   MCV 89.3 07/25/2012 0450   MCH 28.4 08/13/2013 1607   MCH 29.5 07/25/2012 0450   MCHC 31.8 08/13/2013 1607   MCHC 33.1 07/25/2012 0450   RDW 13.0 08/13/2013 1607   RDW 12.4 07/25/2012 0450   LYMPHSABS 1.8 08/13/2013 1607   LYMPHSABS 1.7 07/21/2012 2217   MONOABS 0.5 08/13/2013 1607   MONOABS 0.5 07/21/2012 2217   EOSABS 0.2 08/13/2013 1607   EOSABS 0.1 07/21/2012 2217   BASOSABS 0.1 08/13/2013 1607   BASOSABS 0.0 07/21/2012 2217     Chemistry      Component Value Date/Time   NA 140 07/21/2012 2217   K 3.7 07/21/2012 2217   CL 102 07/21/2012 2217   CO2 28 07/21/2012 2217   BUN 13 07/21/2012 2217   CREATININE 0.65 07/21/2012 2217      Component Value Date/Time   CALCIUM 9.1 07/21/2012 2217   ALKPHOS 30* 07/21/2012 2217   AST 15 07/21/2012 2217    ALT 8 07/21/2012 2217   BILITOT 0.5 07/21/2012 2217       Impression:  #1. 1 year status post episode of portal vein thrombosis likely related to a pelvic surgical procedure. Short-term anticoagulation. Recannulation of vessels on followup Doppler and CT studies done in October 2014. No evidence for any new events.  #2. Atypical pelvic pain syndrome.  I told her she can graduate from our practice at this time. I would be happy to see her again in the future if the need arises.   CC: Patient Care Team: Provider Not In System as PCP - General   Levert FeinsteinGRANFORTUNA,JAMES M, MD 3/4/20157:19 PM

## 2013-08-14 LAB — D-DIMER, QUANTITATIVE: D-Dimer, Quant: 0.27 ug/mL-FEU (ref 0.00–0.48)

## 2013-08-14 LAB — SEDIMENTATION RATE: Sed Rate: 4 mm/hr (ref 0–22)

## 2014-08-18 ENCOUNTER — Other Ambulatory Visit: Payer: Self-pay | Admitting: Obstetrics and Gynecology

## 2014-08-19 LAB — CYTOLOGY - PAP

## 2014-09-02 IMAGING — CT CT ABD-PELV W/ CM
2 of 4 series · 16 of 46 positions shown, 18 images · IV contrast (APPLIED)
Comparison: 10/10/2010

CLINICAL DATA: Left lower quadrant pain, status post diagnostic
laparoscopy for ovarian cysts, prior cholecystectomy

CT ABDOMEN AND PELVIS WITH CONTRAST
TECHNIQUE: Multidetector CT imaging of the abdomen and pelvis was
performed following the standard protocol during bolus
administration of intravenous contrast.
Contrast: 100mL OMNIPAQUE IOHEXOL 300 MG/ML  SOLN

[Series 2: abd/pelv with 5.0 b31f st · axial · 0.70mm/px · z∈[-382,+43]mm · 13 of 93 slices shown, 15 images]
[im 4/93  soft-tissue]
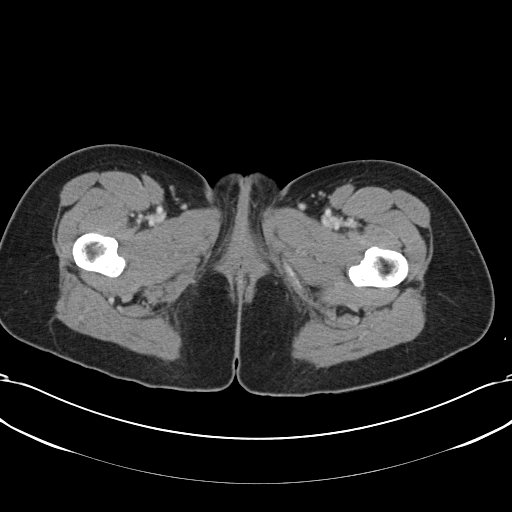
[im 4/93  bone]
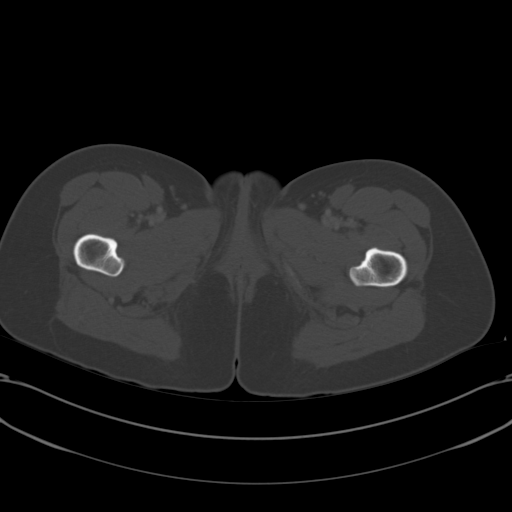
[im 12/93  soft-tissue]
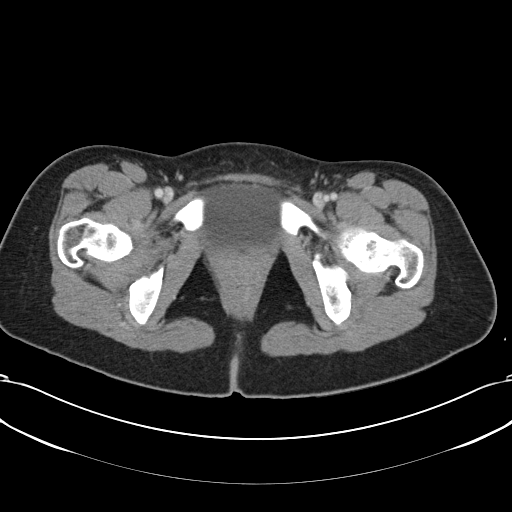
[im 20/93  soft-tissue]
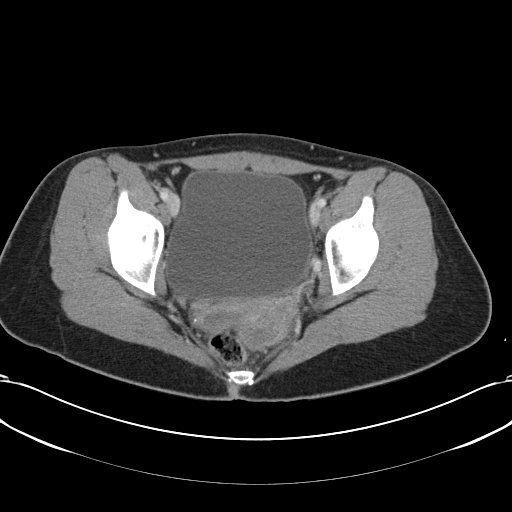
[im 27/93  soft-tissue]
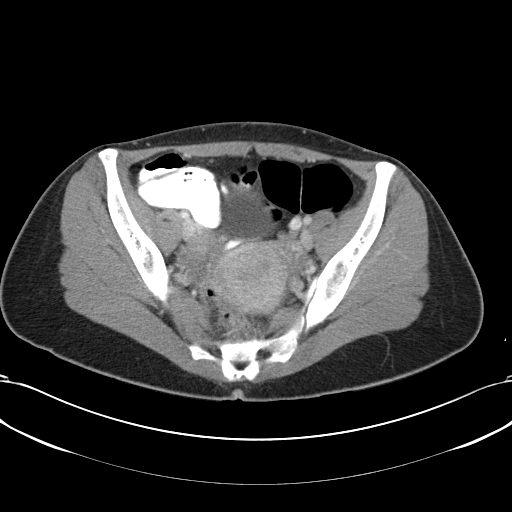
[im 31/93  soft-tissue]
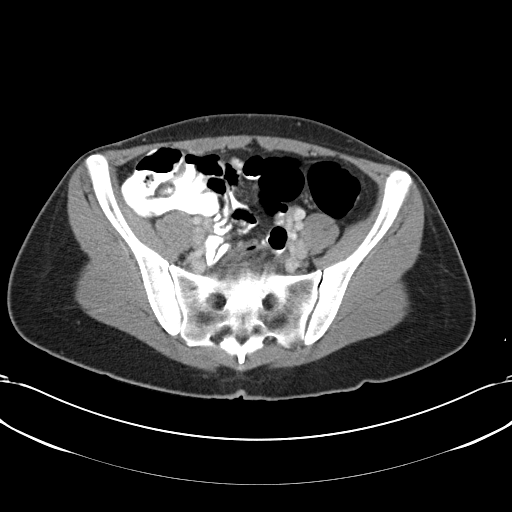
[im 39/93  soft-tissue]
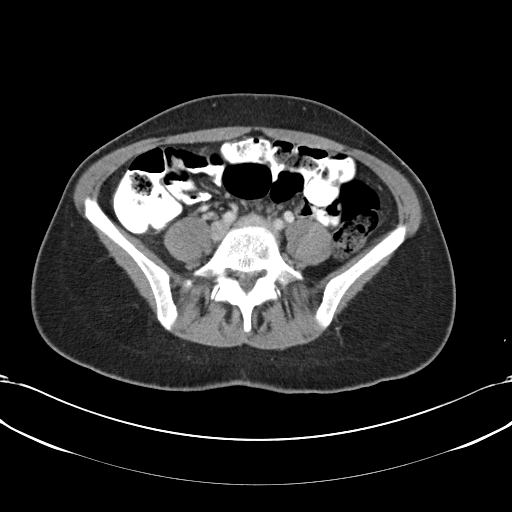
[im 47/93  soft-tissue]
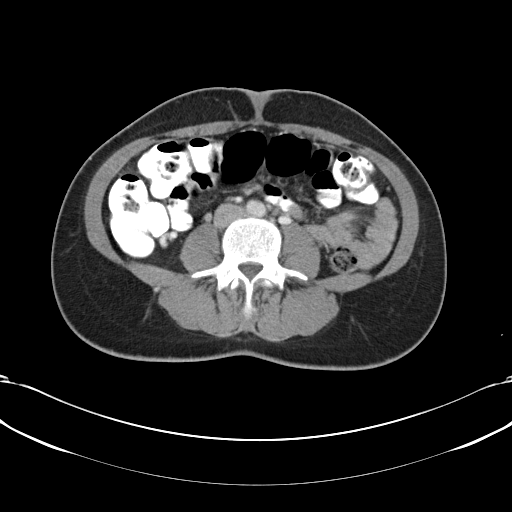
[im 54/93  soft-tissue]
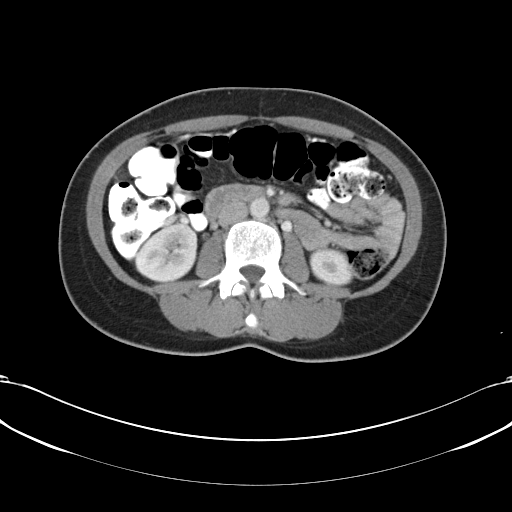
[im 62/93  soft-tissue]
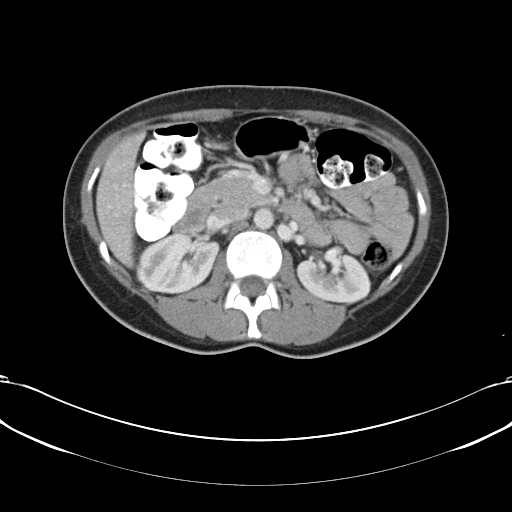
[im 62/93  bone]
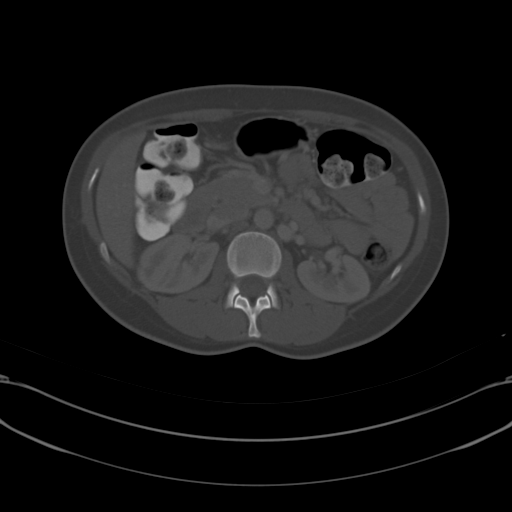
[im 66/93  soft-tissue]
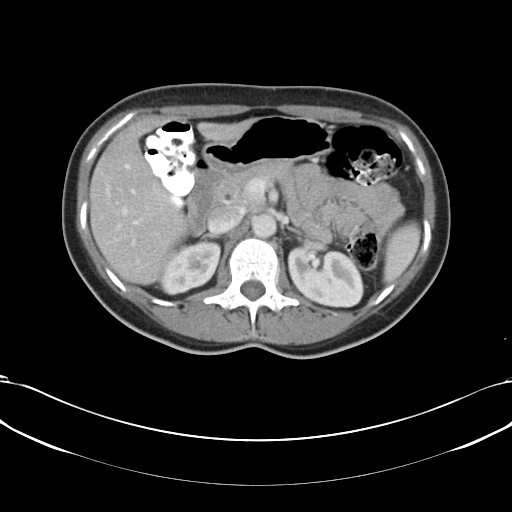
[im 73/93  soft-tissue]
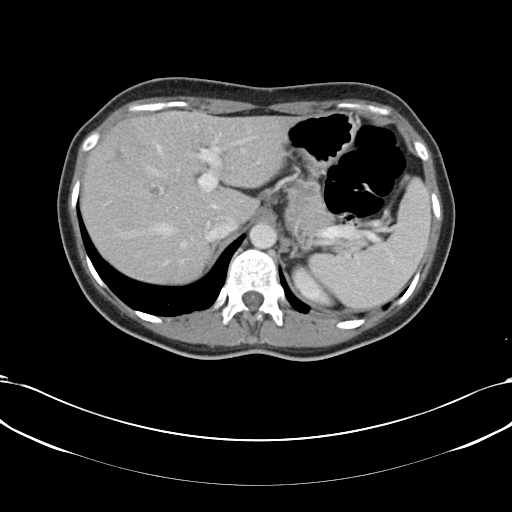
[im 81/93  soft-tissue]
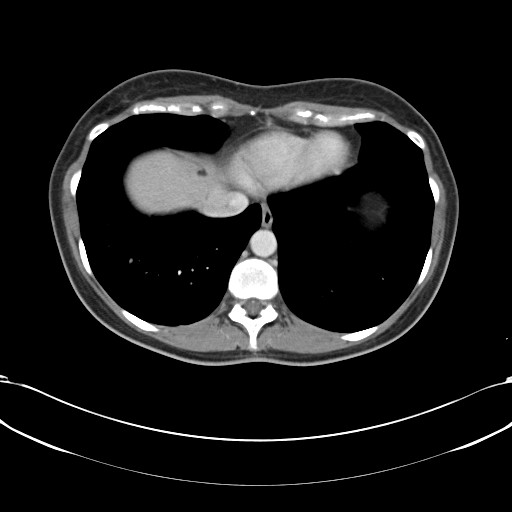
[im 89/93  soft-tissue]
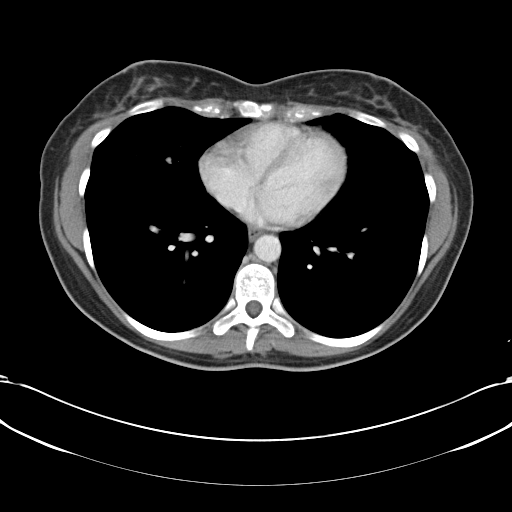

[Series 5: coronals · coronal · 0.90mm/px · 3 of 91 slices shown]
[im 31/91  soft-tissue]
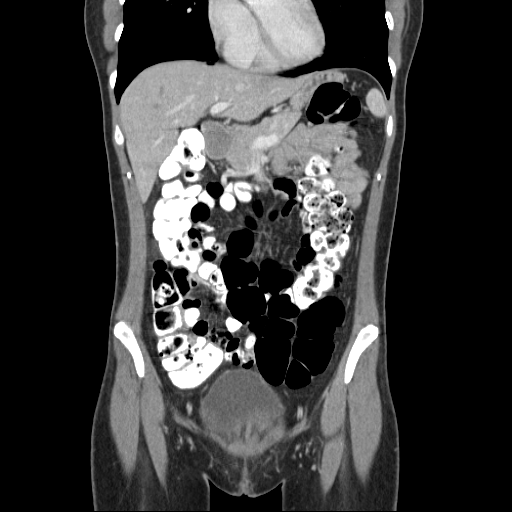
[im 41/91  soft-tissue]
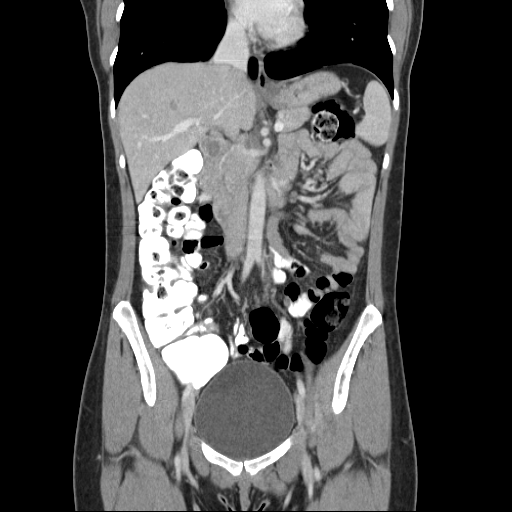
[im 51/91  soft-tissue]
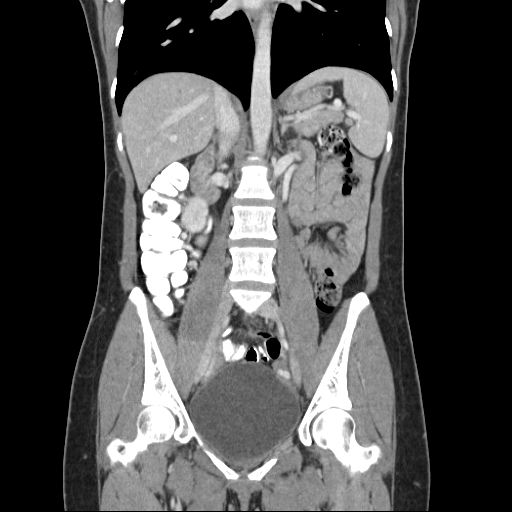

[16 of 46 positions shown; findings below may reference images not displayed]

FINDINGS: Lung bases are clear.

Filling defect within branches of the anterior right portal vein
leading to the right hepatic dome (series 2/image 20).

Heterogeneous appearance of the SMV could reflect mixing artifact
(series 2/image 34), however the unopacified jejunal branches
within the left lower quadrant are also mildly increased in caliber
(series 2/image 39).  This raises concern for possible additional
thrombus.

Liver, spleen, pancreas, and adrenal glands are otherwise within
normal limits.

Status post cholecystectomy.  No intrahepatic or extrahepatic
ductal dilatation.

6 mm left renal cyst (series 2/image 30).  No hydronephrosis.

No evidence of bowel obstruction.  No associated changes in the
bowel to suggest ischemia.  Normal appendix.

No evidence of abdominal aortic aneurysm.

Suspicious abdominopelvic lymphadenopathy.

Trace pelvic ascites, likely physiologic.

Uterus and bilateral ovaries are unremarkable.

Bladder is within normal limits.

Mild postsurgical changes in the anterior abdominal wall.

Visualized osseous structures are within normal limits.
IMPRESSION: Portal vein thrombosis within branches of the right anterior portal
vein.

Possible thrombus within jejunal mesenteric branches in the left
lower quadrant (versus mixing artifact).

No associated changes in the bowel to suggest ischemia.

These results will be called to the ordering clinician or
representative by the Radiologist Assistant, and communication
documented in the PACS Dashboard.

## 2014-10-06 ENCOUNTER — Emergency Department (HOSPITAL_BASED_OUTPATIENT_CLINIC_OR_DEPARTMENT_OTHER): Payer: BLUE CROSS/BLUE SHIELD

## 2014-10-06 ENCOUNTER — Emergency Department (HOSPITAL_BASED_OUTPATIENT_CLINIC_OR_DEPARTMENT_OTHER)
Admission: EM | Admit: 2014-10-06 | Discharge: 2014-10-06 | Disposition: A | Discharge status: Home / Self Care | Payer: BLUE CROSS/BLUE SHIELD | Attending: Emergency Medicine | Admitting: Emergency Medicine

## 2014-10-06 ENCOUNTER — Encounter (HOSPITAL_BASED_OUTPATIENT_CLINIC_OR_DEPARTMENT_OTHER): Payer: Self-pay | Admitting: *Deleted

## 2014-10-06 DIAGNOSIS — Z86718 Personal history of other venous thrombosis and embolism: Secondary | ICD-10-CM | POA: Insufficient documentation

## 2014-10-06 DIAGNOSIS — Y998 Other external cause status: Secondary | ICD-10-CM | POA: Diagnosis not present

## 2014-10-06 DIAGNOSIS — Y9389 Activity, other specified: Secondary | ICD-10-CM | POA: Diagnosis not present

## 2014-10-06 DIAGNOSIS — Z87891 Personal history of nicotine dependence: Secondary | ICD-10-CM | POA: Insufficient documentation

## 2014-10-06 DIAGNOSIS — Y9368 Activity, volleyball (beach) (court): Secondary | ICD-10-CM | POA: Insufficient documentation

## 2014-10-06 DIAGNOSIS — W1839XA Other fall on same level, initial encounter: Secondary | ICD-10-CM | POA: Diagnosis not present

## 2014-10-06 DIAGNOSIS — Z7982 Long term (current) use of aspirin: Secondary | ICD-10-CM | POA: Insufficient documentation

## 2014-10-06 DIAGNOSIS — Y9289 Other specified places as the place of occurrence of the external cause: Secondary | ICD-10-CM | POA: Diagnosis not present

## 2014-10-06 DIAGNOSIS — S79922A Unspecified injury of left thigh, initial encounter: Secondary | ICD-10-CM | POA: Diagnosis present

## 2014-10-06 DIAGNOSIS — M79652 Pain in left thigh: Secondary | ICD-10-CM

## 2014-10-06 HISTORY — DX: Acute embolism and thrombosis of unspecified deep veins of unspecified lower extremity: I82.409

## 2014-10-06 MED ORDER — TRAMADOL HCL 50 MG PO TABS: 50.0000 mg | ORAL_TABLET | Freq: Four times a day (QID) | ORAL | Status: DC | PRN

## 2014-10-06 MED ORDER — CYCLOBENZAPRINE HCL 5 MG PO TABS: 5.0000 mg | ORAL_TABLET | Freq: Two times a day (BID) | ORAL | Status: DC | PRN

## 2014-10-06 MED ORDER — TRAMADOL HCL 50 MG PO TABS
50.0000 mg | ORAL_TABLET | Freq: Once | ORAL | Status: AC
  Administered 2014-10-06: 50 mg via ORAL
  Filled 2014-10-06: qty 1

## 2014-10-06 MED ORDER — ENOXAPARIN SODIUM 60 MG/0.6ML ~~LOC~~ SOLN
1.0000 mg/kg | Freq: Once | SUBCUTANEOUS | Status: AC
  Administered 2014-10-06: 55 mg via SUBCUTANEOUS
  Filled 2014-10-06: qty 0.6

## 2014-10-06 NOTE — ED Notes (Signed)
Pain and swelling to her left upper leg. Pain since after doing a ropes coarse and playing volleyball and falling, landing on her left knee. Left knee pain and swelling also.

## 2014-10-06 NOTE — ED Notes (Signed)
Pt alert x4 respirations easy non labored. Skin w/d 

## 2014-10-06 NOTE — ED Provider Notes (Addendum)
CSN: [id number]     Arrival date & time 10/06/14  1818 History  This chart was scribed for [name], MD by Phillis HaggisGabriella Gaje, ED Scribe. This patient was seen in room MH03/MH03 and patient care was started at 8:01 PM.      Chief Complaint  Patient presents with  . Leg Pain   Patient is a 39 y.o. female presenting with leg pain. The history is provided by the patient. No language interpreter was used.  Leg Pain HPI Comments: Leone PayorGail Wu is a 39 y.o. female who presents to the Emergency Department complaining of upper left leg pain and swelling. She states that she did a ropes course and played volleyball this weekend and had some pain that she attributed to the exercise and falling on her left knee. She reports constant pain and swelling to the upper left leg to right above her knee. She denies that anything makes the pain better or worse. She denies history of similar problems. She states that she is right handed. She reports that she has not played volleyball in a long time. She denies chest pain or trouble breathing. She states that she has taken ibuprofen to no relief. She reports a history of cholecystectomy, in which she may have sustained a blood clot in her portal vein. She states that she has kept UTD on her blood work. She said she was last seen at [facility] in February. She denies being sensitive to medication and states that there is not a chance that she could be pregnant. She denies smoking. She reports that her father has history of blood clots.   Past Medical History  Diagnosis Date  . No pertinent past medical history   . DVT (deep venous thrombosis)    Past Surgical History  Procedure Laterality Date  . Cholecystectomy    . Laparoscopy N/A 07/19/2012    Procedure: LAPAROSCOPY DIAGNOSTIC;  Surgeon: Meriel Picaichard M Holland, MD;  Location: [facility];  Service: Gynecology;  Laterality: N/A;   Family History  Problem Relation Age of Onset  . Hypertension Mother    History   Substance Use Topics  . Smoking status: Former Smoker -- 0 years    Quit date: 08/01/2010  . Smokeless tobacco: Not on file  . Alcohol Use: Yes     Comment: occasionally   OB History    No data available     Review of Systems  Respiratory: Negative for shortness of breath.   Cardiovascular: Positive for leg swelling. Negative for chest pain.  Musculoskeletal: Positive for joint swelling and arthralgias.  All other systems reviewed and are negative.  Allergies  Review of patient's allergies indicates no known allergies.  Home Medications   Prior to Admission medications   Medication Sig Start Date End Date Taking? Authorizing Provider  aspirin 81 MG tablet Take 81 mg by mouth daily.    Historical Provider, MD  cyclobenzaprine (FLEXERIL) 5 MG tablet Take 1 tablet (5 mg total) by mouth 2 (two) times daily as needed for muscle spasms. 10/06/14   [name], MD  ibuprofen (ADVIL,MOTRIN) 800 MG tablet Take 800 mg by mouth every 8 (eight) hours as needed.  08/04/13   Historical Provider, MD  traMADol (ULTRAM) 50 MG tablet Take 1 tablet (50 mg total) by mouth every 6 (six) hours as needed. 10/06/14   [name], MD   BP 119/78 mmHg  Pulse 67  Temp(Src) 98 F (36.7 C) (Oral)  Resp 16  Ht 5\' 3"  (1.6 m)  Wt 117 lb (53.071 kg)  BMI 20.73 kg/m2  SpO2 100%  LMP 09/29/2014   Physical Exam  Constitutional: She appears well-developed and well-nourished. She is cooperative.  HENT:  Head: Normocephalic and atraumatic.  Eyes: Conjunctivae are normal. Right eye exhibits no discharge. Left eye exhibits no discharge.  Cardiovascular: Normal rate, regular rhythm and normal heart sounds.   Pulmonary/Chest: Effort normal and breath sounds normal. No respiratory distress.  Abdominal: Soft. She exhibits no distension.  Musculoskeletal: She exhibits tenderness. She exhibits no edema.  Tenderness over the left thigh somewhat medially, no clear swelling, no ecchymosis, no erythema;  decreased ROM of knee due to pain, no swelling of the knee; no edema to lower left leg.   Neurological: She is alert.  Skin: Skin is warm and dry.  Psychiatric: She has a normal mood and affect.  Nursing note and vitals reviewed.   ED Course  Procedures (including critical care time)  8:06 PM-Discussed treatment plan which includes Lovenox shot and pain medication with pt at bedside and pt agreed to plan.   Labs Review Labs Reviewed - No data to display  Imaging Review No results found.   EKG Interpretation None      MDM   Final diagnoses:  Thigh pain, musculoskeletal, left   Patient with pain in her left thigh. Likely muscle skeletal pain from playing volleyball, however she does have previous history of DVT. There is no distal swelling. Will treat with Lovenox and get Doppler tomorrow. Will treat with muscle relaxers and pain medicine. No abnormality of the knee. I personally performed the services described in this documentation, which was scribed in my presence. The recorded information has been reviewed and is accurate.     Benjiman Core, MD 10/06/14 2107  Ultrasound was still hearing able to get the Doppler tonight. It was negative patient will be discharged home  Benjiman Core, MD 10/06/14 2302

## 2014-10-26 ENCOUNTER — Other Ambulatory Visit: Payer: Self-pay | Admitting: Orthopaedic Surgery

## 2014-10-26 DIAGNOSIS — M25562 Pain in left knee: Secondary | ICD-10-CM

## 2014-10-28 ENCOUNTER — Ambulatory Visit
Admission: RE | Admit: 2014-10-28 | Discharge: 2014-10-28 | Disposition: A | Discharge status: Home / Self Care | Payer: BLUE CROSS/BLUE SHIELD | Source: Ambulatory Visit | Attending: Orthopaedic Surgery | Admitting: Orthopaedic Surgery

## 2014-10-28 DIAGNOSIS — M25562 Pain in left knee: Secondary | ICD-10-CM

## 2015-08-23 ENCOUNTER — Other Ambulatory Visit: Payer: Self-pay | Admitting: Obstetrics and Gynecology

## 2015-08-24 LAB — CYTOLOGY - PAP

## 2016-03-30 ENCOUNTER — Ambulatory Visit: Payer: BLUE CROSS/BLUE SHIELD

## 2016-06-12 HISTORY — PX: SHOULDER ARTHROSCOPY WITH OPEN ROTATOR CUFF REPAIR: SHX6092

## 2016-08-24 ENCOUNTER — Other Ambulatory Visit: Payer: Self-pay | Admitting: Obstetrics and Gynecology

## 2016-08-25 LAB — CYTOLOGY - PAP

## 2017-04-27 ENCOUNTER — Encounter: Payer: Self-pay | Admitting: Oncology

## 2017-04-27 NOTE — Progress Notes (Signed)
Hematology: I received a communication [facility], Dr. Valma CavaAndrew Collins, with reference to recommendations about the need for perioperative anticoagulation around a planned left shoulder rotator cuff repair.  Ms. Kathy Wu is now 41 years old.  I last saw her in March 2015.  She has a history of a portal vein thrombosis in February 2014 which occurred about 1 week after an exploratory laparoscopic procedure was done to evaluate atypical pelvic pain.  Hypercoagulation evaluation was unremarkable.  I felt that this was likely a provoked event.  She was anticoagulated with warfarin for 6 months.  She has had no subsequent thrombotic events.  I would estimate her thrombotic risk for this procedure as low.  Recommendation: Prophylactic dose Xarelto 10 mg daily for 2 weeks to start the morning after surgery.  If the procedure goes smoothly and is done early in the day, she could start the Xarelto that evening.  She has not had a blood count recorded in our system since her visit with me in March 2015.  I would get a CBC and a electrolyte panel, specifically BUN and creatinine prior to surgery.  I called and discussed this with the patient by phone.

## 2017-04-30 ENCOUNTER — Other Ambulatory Visit: Payer: Self-pay | Admitting: Oncology

## 2017-04-30 MED ORDER — RIVAROXABAN 10 MG PO TABS: ORAL_TABLET | ORAL | 0 refills | Status: DC

## 2017-05-15 ENCOUNTER — Telehealth: Payer: Self-pay | Admitting: *Deleted

## 2017-05-15 NOTE — Telephone Encounter (Signed)
rec'd a message from karen in IM to return call to carrie at [facility] [phone], called she states she has got everything she needs now.

## 2018-03-13 ENCOUNTER — Other Ambulatory Visit: Payer: Self-pay

## 2018-03-13 ENCOUNTER — Encounter: Payer: Self-pay | Admitting: Oncology

## 2018-03-13 ENCOUNTER — Telehealth: Payer: Self-pay | Admitting: *Deleted

## 2018-03-13 ENCOUNTER — Other Ambulatory Visit (INDEPENDENT_AMBULATORY_CARE_PROVIDER_SITE_OTHER): Payer: BLUE CROSS/BLUE SHIELD

## 2018-03-13 ENCOUNTER — Ambulatory Visit (HOSPITAL_COMMUNITY)
Admission: RE | Admit: 2018-03-13 | Discharge: 2018-03-13 | Disposition: A | Discharge status: Home / Self Care | Payer: BLUE CROSS/BLUE SHIELD | Source: Ambulatory Visit | Attending: Oncology | Admitting: Oncology

## 2018-03-13 ENCOUNTER — Other Ambulatory Visit: Payer: Self-pay | Admitting: Oncology

## 2018-03-13 ENCOUNTER — Ambulatory Visit: Payer: BLUE CROSS/BLUE SHIELD | Admitting: Oncology

## 2018-03-13 ENCOUNTER — Encounter (HOSPITAL_COMMUNITY): Payer: Self-pay

## 2018-03-13 VITALS — BP 111/78 | HR 70 | Temp 98.2°F | Wt 129.4 lb

## 2018-03-13 DIAGNOSIS — I81 Portal vein thrombosis: Secondary | ICD-10-CM | POA: Diagnosis present

## 2018-03-13 DIAGNOSIS — R1030 Lower abdominal pain, unspecified: Secondary | ICD-10-CM | POA: Insufficient documentation

## 2018-03-13 DIAGNOSIS — R1031 Right lower quadrant pain: Secondary | ICD-10-CM | POA: Diagnosis not present

## 2018-03-13 DIAGNOSIS — Z8249 Family history of ischemic heart disease and other diseases of the circulatory system: Secondary | ICD-10-CM

## 2018-03-13 DIAGNOSIS — G8929 Other chronic pain: Secondary | ICD-10-CM

## 2018-03-13 DIAGNOSIS — R102 Pelvic and perineal pain: Secondary | ICD-10-CM | POA: Diagnosis not present

## 2018-03-13 DIAGNOSIS — Z86718 Personal history of other venous thrombosis and embolism: Secondary | ICD-10-CM | POA: Diagnosis not present

## 2018-03-13 DIAGNOSIS — N83202 Unspecified ovarian cyst, left side: Secondary | ICD-10-CM | POA: Insufficient documentation

## 2018-03-13 LAB — CBC WITH DIFFERENTIAL/PLATELET
Abs Immature Granulocytes: 0 10*3/uL (ref 0.0–0.1)
Basophils Absolute: 0.1 10*3/uL (ref 0.0–0.1)
Basophils Relative: 1 %
Eosinophils Absolute: 0.1 10*3/uL (ref 0.0–0.7)
Eosinophils Relative: 2 %
HCT: 40.8 % (ref 36.0–46.0)
Hemoglobin: 12.8 g/dL (ref 12.0–15.0)
Immature Granulocytes: 0 %
Lymphocytes Relative: 24 %
Lymphs Abs: 1.4 10*3/uL (ref 0.7–4.0)
MCH: 28.8 pg (ref 26.0–34.0)
MCHC: 31.4 g/dL (ref 30.0–36.0)
MCV: 91.9 fL (ref 78.0–100.0)
Monocytes Absolute: 0.6 10*3/uL (ref 0.1–1.0)
Monocytes Relative: 9 %
Neutro Abs: 3.8 10*3/uL (ref 1.7–7.7)
Neutrophils Relative %: 64 %
Platelets: 164 10*3/uL (ref 150–400)
RBC: 4.44 MIL/uL (ref 3.87–5.11)
RDW: 12.6 % (ref 11.5–15.5)
WBC: 6 10*3/uL (ref 4.0–10.5)

## 2018-03-13 LAB — COMPREHENSIVE METABOLIC PANEL
ALT: 12 U/L (ref 0–44)
AST: 17 U/L (ref 15–41)
Albumin: 4 g/dL (ref 3.5–5.0)
Alkaline Phosphatase: 36 U/L — ABNORMAL LOW (ref 38–126)
Anion gap: 7 (ref 5–15)
BUN: 12 mg/dL (ref 6–20)
CO2: 26 mmol/L (ref 22–32)
Calcium: 9.5 mg/dL (ref 8.9–10.3)
Chloride: 107 mmol/L (ref 98–111)
Creatinine, Ser: 0.77 mg/dL (ref 0.44–1.00)
GFR calc Af Amer: >60 mL/min (ref >60)
GFR calc non Af Amer: >60 mL/min (ref >60)
Glucose, Bld: 81 mg/dL (ref 70–99)
Potassium: 3.8 mmol/L (ref 3.5–5.1)
Sodium: 140 mmol/L (ref 135–145)
Total Bilirubin: 0.7 mg/dL (ref 0.3–1.2)
Total Protein: 6.8 g/dL (ref 6.5–8.1)

## 2018-03-13 LAB — LACTATE DEHYDROGENASE: LDH: 108 U/L (ref 98–192)

## 2018-03-13 LAB — D-DIMER, QUANTITATIVE: D-Dimer, Quant: <0.27 ug/mL-FEU (ref 0.00–0.50)

## 2018-03-13 MED ORDER — IOHEXOL 300 MG/ML  SOLN
100.0000 mL | Freq: Once | INTRAMUSCULAR | Status: AC | PRN
  Administered 2018-03-13: 100 mL via INTRAVENOUS

## 2018-03-13 NOTE — Telephone Encounter (Signed)
I called pt. I will get her in today for stat labs & CT Abdomen pelvis w contrast.

## 2018-03-13 NOTE — Telephone Encounter (Addendum)
Returned pt's call - stated she had blood clot about 3 -4 yrs ago; and now for 4 moonths she is having the same pain in her lower groin area. Stated she had surgery.  Talked to Dr Reece Agar - stated he will review her chart first.

## 2018-03-14 ENCOUNTER — Encounter: Payer: Self-pay | Admitting: Oncology

## 2018-03-14 NOTE — Progress Notes (Signed)
Hematology and Oncology Follow Up Visit  Kathy Wu [id number] 06-22-1975 42 y.o. 03/14/2018 4:07 PM   Principle Diagnosis: Encounter Diagnoses  Name Primary?  . RLQ abdominal pain Yes  . Portal vein thrombosis      Interim History:  42 year old woman I evaluated back in February, 2014.  About 1 week following a laparoscopic procedure to evaluate unexplained right lower abdominal pain, she developed unexplained vaginal bleeding.  CT scan of the abdomen and pelvis was done and showed evidence for a recent portal vein thrombosis.  There was evidence for recanalization making this likely a subacute event.  I did recommend anticoagulation for 6 months with warfarin.  Special hematology study showed borderline decreased total protein S with moderate decrease in functional protein S with concomitant normal protein C levels. Family history positive for a unprovoked DVT in her father and a DVT in her sister following leg trauma. Protein S levels were repeated after she had completed her prescribed course of Coumadin and  the free and functional protein S was low normal at 59% of control and functional protein S borderline decreased at 65% of control.  I evaluated her again in March 2015 when she developed recurrent right lower quadrant abdominal pain.  CT scan and vascular Doppler studies showed complete recanalization of the portal vein.  Anticoagulation was not resumed.  She saw her a pelvic pain specialist at [facility].  Her symptoms resolved until about 4 weeks ago when they recurred.  She saw her OB/GYN.  Pelvic exam and ultrasound reported to her as normal.  Due to her concern that symptoms were similar to those she experienced at the time of her portal vein thrombosis, she was told to call me for further evaluation.   Medications: reviewed  Allergies: No Known Allergies  Review of Systems: She denied any dyspnea, chest pain, palpitations, calf tenderness or  swelling. Remaining ROS negative:   Physical Exam: Blood pressure 111/78, pulse 70, temperature 98.2 F (36.8 C), temperature source Oral, weight 129 lb 6.4 oz (58.7 kg), last menstrual period 02/25/2018, SpO2 100 %. Wt Readings from Last 3 Encounters:  03/13/18 129 lb 6.4 oz (58.7 kg)  10/06/14 117 lb (53.1 kg)  08/13/13 118 lb 11.2 oz (53.8 kg)     General appearance: Well-nourished Caucasian woman HENNT: Pharynx no erythema, exudate, mass, or ulcer. No thyromegaly or thyroid nodules Lymph nodes: No cervical, supraclavicular, or axillary lymphadenopathy Breasts: Lungs: Clear to auscultation, resonant to percussion throughout Heart: Regular rhythm, no murmur, no gallop, no rub, no click, no edema Abdomen: Soft, nontender, normal bowel sounds, no mass, no organomegaly Extremities: No edema, no calf tenderness Musculoskeletal: no joint deformities GU:  Vascular: Carotid pulses 2+, no bruits, distal pulses: Dorsalis pedis 1+ symmetric Neurologic: Alert, oriented, PERRLA, optic discs sharp and vessels normal, no hemorrhage or exudate, cranial nerves grossly normal, motor strength 5 over 5, reflexes 1+ symmetric, upper body coordination normal, gait normal, Skin: No rash or ecchymosis  Lab Results: CBC W/Diff    Component Value Date/Time   WBC 6.0 03/13/2018 1224   RBC 4.44 03/13/2018 1224   HGB 12.8 03/13/2018 1224   HGB 12.6 08/13/2013 1607   HCT 40.8 03/13/2018 1224   HCT 39.6 08/13/2013 1607   PLT 164 03/13/2018 1224   PLT 148 08/13/2013 1607   MCV 91.9 03/13/2018 1224   MCV 89.1 08/13/2013 1607   MCH 28.8 03/13/2018 1224   MCHC 31.4 03/13/2018 1224   RDW 12.6 03/13/2018 1224  RDW 13.0 08/13/2013 1607   LYMPHSABS 1.4 03/13/2018 1224   LYMPHSABS 1.8 08/13/2013 1607   MONOABS 0.6 03/13/2018 1224   MONOABS 0.5 08/13/2013 1607   EOSABS 0.1 03/13/2018 1224   EOSABS 0.2 08/13/2013 1607   BASOSABS 0.1 03/13/2018 1224   BASOSABS 0.1 08/13/2013 1607     Chemistry       Component Value Date/Time   NA 140 03/13/2018 1224   K 3.8 03/13/2018 1224   CL 107 03/13/2018 1224   CO2 26 03/13/2018 1224   BUN 12 03/13/2018 1224   CREATININE 0.77 03/13/2018 1224      Component Value Date/Time   CALCIUM 9.5 03/13/2018 1224   ALKPHOS 36 (L) 03/13/2018 1224   AST 17 03/13/2018 1224   ALT 12 03/13/2018 1224   BILITOT 0.7 03/13/2018 1224       Radiological Studies: Ct Abdomen Pelvis W Contrast  Result Date: 03/13/2018 CLINICAL DATA:  Right lower quadrant pain for 4 months. Personal history of portal vein thrombosis. EXAM: CT ABDOMEN AND PELVIS WITH CONTRAST TECHNIQUE: Multidetector CT imaging of the abdomen and pelvis was performed using the standard protocol following bolus administration of intravenous contrast. CONTRAST:  OMNIPAQUE IOHEXOL 300 MG/ML  SOLN COMPARISON:  03/11/2013 FINDINGS: Lower Chest: No acute findings. Hepatobiliary: No hepatic masses identified. Prior cholecystectomy. No evidence of biliary obstruction. Pancreas:  No mass or inflammatory changes. Spleen: Within normal limits in size and appearance. Adrenals/Urinary Tract: A few tiny sub-cm renal cysts are again noted. No masses identified. No evidence of hydronephrosis. Unremarkable unopacified urinary bladder. Stomach/Bowel: No evidence of obstruction, inflammatory process or abnormal fluid collections. Normal appendix visualized. Vascular/Lymphatic: No pathologically enlarged lymph nodes. No abdominal aortic aneurysm. No evidence of portal vein thrombosis or other significant abnormality. Reproductive: Normal appearance of uterus. A benign-appearing left ovarian cyst is seen measuring 3.0 cm, which is most likely physiologic in a reproductive age female. No other pelvic mass, inflammatory process, or abnormal fluid collections. Other:  None. Musculoskeletal:  No suspicious bone lesions identified. IMPRESSION: 3 cm benign-appearing left ovarian cyst, most likely physiologic in a reproductive age  female. No evidence of portal vein thrombosis or other acute findings. Electronically Signed   By: Myles Rosenthal M.D.   On: 03/13/2018 17:41    Impression: Recurrent atypical right lower quadrant abdominal pain acute on chronic. No evidence for recurrent portal vein thrombosis or any other intra-abdominal or acute pelvic process. I suspect that she is having pain either secondary to adhesions or low-grade endometriosis. I do not plan any further evaluation through this office.    CC: Patient Care Team: Levi Aland, MD as PCP - General (Obstetrics and Gynecology) Purvis Sheffield, MD as Consulting Physician Cyndie Chime, Genene Churn, MD as Consulting Physician (Oncology)   Cephas Darby, MD, FACP  Hematology-Oncology/Internal Medicine     10/3/20194:07 PM

## 2019-12-11 HISTORY — PX: COLONOSCOPY: SHX174

## 2020-05-14 ENCOUNTER — Other Ambulatory Visit: Payer: Self-pay | Admitting: Obstetrics and Gynecology

## 2020-05-14 DIAGNOSIS — N939 Abnormal uterine and vaginal bleeding, unspecified: Secondary | ICD-10-CM

## 2020-05-27 ENCOUNTER — Ambulatory Visit
Admission: RE | Admit: 2020-05-27 | Discharge: 2020-05-27 | Disposition: A | Discharge status: Home / Self Care | Payer: BLUE CROSS/BLUE SHIELD | Source: Ambulatory Visit | Attending: Obstetrics and Gynecology | Admitting: Obstetrics and Gynecology

## 2020-05-27 DIAGNOSIS — N939 Abnormal uterine and vaginal bleeding, unspecified: Secondary | ICD-10-CM

## 2020-09-11 ENCOUNTER — Telehealth: Payer: BC Managed Care – PPO | Admitting: Orthopedic Surgery

## 2020-09-11 DIAGNOSIS — R0989 Other specified symptoms and signs involving the circulatory and respiratory systems: Secondary | ICD-10-CM

## 2020-09-11 MED ORDER — AMOXICILLIN-POT CLAVULANATE 875-125 MG PO TABS: 1.0000 | ORAL_TABLET | Freq: Two times a day (BID) | ORAL | 0 refills | Status: AC

## 2020-09-11 NOTE — Progress Notes (Signed)

## 2020-09-13 ENCOUNTER — Other Ambulatory Visit: Payer: Self-pay

## 2020-09-13 ENCOUNTER — Encounter (HOSPITAL_BASED_OUTPATIENT_CLINIC_OR_DEPARTMENT_OTHER): Payer: Self-pay | Admitting: *Deleted

## 2020-09-13 ENCOUNTER — Emergency Department (HOSPITAL_BASED_OUTPATIENT_CLINIC_OR_DEPARTMENT_OTHER): Payer: BC Managed Care – PPO

## 2020-09-13 DIAGNOSIS — Z7901 Long term (current) use of anticoagulants: Secondary | ICD-10-CM | POA: Insufficient documentation

## 2020-09-13 DIAGNOSIS — R0781 Pleurodynia: Secondary | ICD-10-CM | POA: Diagnosis present

## 2020-09-13 DIAGNOSIS — Z87891 Personal history of nicotine dependence: Secondary | ICD-10-CM | POA: Diagnosis not present

## 2020-09-13 DIAGNOSIS — Z7982 Long term (current) use of aspirin: Secondary | ICD-10-CM | POA: Insufficient documentation

## 2020-09-13 DIAGNOSIS — R0602 Shortness of breath: Secondary | ICD-10-CM | POA: Insufficient documentation

## 2020-09-13 LAB — CBC WITH DIFFERENTIAL/PLATELET
Abs Immature Granulocytes: 0.03 10*3/uL (ref 0.00–0.07)
Basophils Absolute: 0 10*3/uL (ref 0.0–0.1)
Basophils Relative: 0 %
Eosinophils Absolute: 0 10*3/uL (ref 0.0–0.5)
Eosinophils Relative: 0 %
HCT: 41.5 % (ref 36.0–46.0)
Hemoglobin: 13.2 g/dL (ref 12.0–15.0)
Immature Granulocytes: 0 %
Lymphocytes Relative: 13 %
Lymphs Abs: 1.3 10*3/uL (ref 0.7–4.0)
MCH: 27.3 pg (ref 26.0–34.0)
MCHC: 31.8 g/dL (ref 30.0–36.0)
MCV: 85.7 fL (ref 80.0–100.0)
Monocytes Absolute: 0.6 10*3/uL (ref 0.1–1.0)
Monocytes Relative: 6 %
Neutro Abs: 8.1 10*3/uL — ABNORMAL HIGH (ref 1.7–7.7)
Neutrophils Relative %: 81 %
Platelets: 239 10*3/uL (ref 150–400)
RBC: 4.84 MIL/uL (ref 3.87–5.11)
RDW: 13.4 % (ref 11.5–15.5)
WBC: 10.1 10*3/uL (ref 4.0–10.5)
nRBC: 0 % (ref 0.0–0.2)

## 2020-09-13 LAB — TROPONIN I (HIGH SENSITIVITY): Troponin I (High Sensitivity): <2 ng/L (ref <18)

## 2020-09-13 LAB — D-DIMER, QUANTITATIVE: D-Dimer, Quant: 0.37 ug/mL-FEU (ref 0.00–0.50)

## 2020-09-13 LAB — BASIC METABOLIC PANEL
Anion gap: 13 (ref 5–15)
BUN: 13 mg/dL (ref 6–20)
CO2: 24 mmol/L (ref 22–32)
Calcium: 9.8 mg/dL (ref 8.9–10.3)
Chloride: 100 mmol/L (ref 98–111)
Creatinine, Ser: 0.71 mg/dL (ref 0.44–1.00)
GFR, Estimated: >60 mL/min (ref >60)
Glucose, Bld: 132 mg/dL — ABNORMAL HIGH (ref 70–99)
Potassium: 3.7 mmol/L (ref 3.5–5.1)
Sodium: 137 mmol/L (ref 135–145)

## 2020-09-13 NOTE — ED Triage Notes (Signed)
States she was sick with a URI last week. She was started Augmentin which she continues to take. She took her last steroid today. Here tonight with difficulty taking a deep breath.

## 2020-09-13 NOTE — ED Triage Notes (Signed)
Emergency Medicine Provider Triage Evaluation Note  Kathy Wu , a 45 y.o. female  was evaluated in triage.  Pt complains of  Difficulties breathing. She is currently on Augmentin for URI. Finished steroids today. Shortness of breath associated with central chest pain worse with deep inspiration. History of clot. No currently on any anticoagulants. No lower extremity edema.   Review of Systems  Positive: Chest pain, shortness of breath Negative: fever  Physical Exam  BP (!) 146/96 (BP Location: Right Arm)   Pulse 93   Temp 98.3 F (36.8 C) (Oral)   Resp 16   Ht 5\' 3"  (1.6 m)   Wt 56.7 kg   LMP 08/23/2020   SpO2 97%   BMI 22.14 kg/m  Gen:   Awake, no distress   HEENT:  Atraumatic  Resp:  Normal effort  Cardiac:  Normal rate  Abd:   Nondistended, nontender  MSK:   Moves extremities without difficulty  Neuro:  Speech clear   Medical Decision Making  Medically screening exam initiated at 8:33 PM.  Appropriate orders placed.  08/25/2020 was informed that the remainder of the evaluation will be completed by another provider, this initial triage assessment does not replace that evaluation, and the importance of remaining in the ED until their evaluation is complete.  Clinical Impression  Labs ordered. CXR and EKG   [name] 09/13/20 2116

## 2020-09-14 ENCOUNTER — Emergency Department (HOSPITAL_BASED_OUTPATIENT_CLINIC_OR_DEPARTMENT_OTHER)
Admission: EM | Admit: 2020-09-14 | Discharge: 2020-09-14 | Disposition: A | Discharge status: Home / Self Care | Payer: BC Managed Care – PPO | Attending: Emergency Medicine | Admitting: Emergency Medicine

## 2020-09-14 ENCOUNTER — Emergency Department (HOSPITAL_BASED_OUTPATIENT_CLINIC_OR_DEPARTMENT_OTHER): Payer: BC Managed Care – PPO

## 2020-09-14 DIAGNOSIS — R0781 Pleurodynia: Secondary | ICD-10-CM

## 2020-09-14 LAB — TROPONIN I (HIGH SENSITIVITY): Troponin I (High Sensitivity): <2 ng/L (ref <18)

## 2020-09-14 MED ORDER — KETOROLAC TROMETHAMINE 30 MG/ML IJ SOLN
30.0000 mg | Freq: Once | INTRAMUSCULAR | Status: AC
  Administered 2020-09-14: 30 mg via INTRAVENOUS
  Filled 2020-09-14: qty 1

## 2020-09-14 MED ORDER — IOHEXOL 350 MG/ML SOLN
100.0000 mL | Freq: Once | INTRAVENOUS | Status: AC | PRN
  Administered 2020-09-14: 100 mL via INTRAVENOUS

## 2020-09-14 MED ORDER — NAPROXEN 500 MG PO TABS: 500.0000 mg | ORAL_TABLET | Freq: Two times a day (BID) | ORAL | 0 refills | Status: DC

## 2020-09-14 NOTE — Discharge Instructions (Signed)
There is no evidence of heart attack or blood clot in the lung.  You may take anti-inflammatories as needed for your pain.  Return to the ED for chest pain becomes exertional, associated with shortness of breath, nausea, vomiting, sweating, other concerns

## 2020-09-14 NOTE — ED Provider Notes (Signed)
[facility] EMERGENCY DEPARTMENT Provider Note   CSN: [id number] Arrival date & time: 09/13/20  2016     History Chief Complaint  Patient presents with  . Chest Pain    Kathy Wu is a 45 y.o. female.  Patient with history of portal vein thrombosis no longer on anticoagulation presenting with pleuritic chest pain since yesterday.  The pain is constant, worse with palpation and worse with deep breathing.  Associate with some shortness of breath. Patient has been treated for URI with Augmentin which she is still on for the past 5 days as well as a course of steroids which she finished today.  She states she has not been coughing much. Her chest is sore to palpation and sore with deep breathing and she is concerned about another clot. She denies any cardiac history.  She denies abdominal pain, nausea or vomiting.  Denies any fever.  No leg pain or leg swelling. Pain is not exertional.  Pain does not radiate.  The history is provided by the patient.       Past Medical History:  Diagnosis Date  . DVT (deep venous thrombosis) (HCC)   . No pertinent past medical history     Patient Active Problem List   Diagnosis Date Noted  . Portal vein thrombosis 07/23/2012  . RLQ abdominal pain 07/22/2012  . Lower extremity weakness 07/21/2012  . Cholecystitis chronic 12/08/2010    Past Surgical History:  Procedure Laterality Date  . CHOLECYSTECTOMY    . LAPAROSCOPY N/A 07/19/2012   Procedure: LAPAROSCOPY DIAGNOSTIC;  Surgeon: Meriel Pica, MD;  Location: [facility];  Service: Gynecology;  Laterality: N/A;     OB History   No obstetric history on file.     Family History  Problem Relation Age of Onset  . Hypertension Mother     Social History   Tobacco Use  . Smoking status: Former Smoker    Years: 0.00    Quit date: 08/01/2010    Years since quitting: 10.1  . Smokeless tobacco: Never Used  Substance Use Topics  . Alcohol use: Yes    Comment: occasionally  .  Drug use: No    Home Medications Prior to Admission medications   Medication Sig Start Date End Date Taking? Authorizing Provider  amoxicillin-clavulanate (AUGMENTIN) 875-125 MG tablet Take 1 tablet by mouth every 12 (twelve) hours for 7 days. 09/11/20 09/18/20 Yes Freeman Caldron, PA-C  aspirin 81 MG tablet Take 81 mg by mouth daily.   Yes [provider]  cyclobenzaprine (FLEXERIL) 5 MG tablet Take 1 tablet (5 mg total) by mouth 2 (two) times daily as needed for muscle spasms. 10/06/14   Benjiman Core, MD  ibuprofen (ADVIL,MOTRIN) 800 MG tablet Take 800 mg by mouth every 8 (eight) hours as needed.  08/04/13   [provider]  rivaroxaban (XARELTO) 10 MG TABS tablet Take 1 tablet daily by mouth to begin the morning after surgery and continue for total of 14 days; stop aspirin while you are on Xarelto 04/30/17   Levert Feinstein, MD  traMADol (ULTRAM) 50 MG tablet Take 1 tablet (50 mg total) by mouth every 6 (six) hours as needed. 10/06/14   Benjiman Core, MD    Allergies    Patient has no known allergies.  Review of Systems   Review of Systems  Constitutional: Negative for activity change, appetite change, fatigue and fever.  HENT: Negative for congestion and rhinorrhea.   Respiratory: Positive for chest tightness and  shortness of breath.   Cardiovascular: Positive for chest pain.  Gastrointestinal: Negative for abdominal pain, nausea and vomiting.  Genitourinary: Negative for dysuria and hematuria.  Musculoskeletal: Negative for arthralgias and myalgias.  Skin: Negative for rash.  Neurological: Negative for dizziness, weakness and headaches.   all other systems are negative except as noted in the HPI and PMH.   Physical Exam Updated Vital Signs BP 136/85   Pulse 64   Temp 98.3 F (36.8 C) (Oral)   Resp 14   Ht 5\' 3"  (1.6 m)   Wt 56.7 kg   LMP 08/23/2020   SpO2 98%   BMI 22.14 kg/m   Physical Exam Vitals and nursing note reviewed.   Constitutional:      General: She is not in acute distress.    Appearance: She is well-developed.  HENT:     Head: Normocephalic and atraumatic.     Mouth/Throat:     Pharynx: No oropharyngeal exudate.  Eyes:     Conjunctiva/sclera: Conjunctivae normal.     Pupils: Pupils are equal, round, and reactive to light.  Neck:     Comments: No meningismus. Cardiovascular:     Rate and Rhythm: Normal rate and regular rhythm.     Heart sounds: Normal heart sounds. No murmur heard.   Pulmonary:     Effort: Pulmonary effort is normal. No respiratory distress.     Breath sounds: Normal breath sounds.     Comments: Central chest tenderness Chest:     Chest wall: Tenderness present.  Abdominal:     Palpations: Abdomen is soft.     Tenderness: There is no abdominal tenderness. There is no guarding or rebound.  Musculoskeletal:        General: No tenderness. Normal range of motion.     Cervical back: Normal range of motion and neck supple.     Right lower leg: No edema.     Left lower leg: No edema.  Skin:    General: Skin is warm.  Neurological:     Mental Status: She is alert and oriented to person, place, and time.     Cranial Nerves: No cranial nerve deficit.     Motor: No abnormal muscle tone.     Coordination: Coordination normal.     Comments:  5/5 strength throughout. CN 2-12 intact.Equal grip strength.   Psychiatric:        Behavior: Behavior normal.     ED Results / Procedures / Treatments   Labs (all labs ordered are listed, but only abnormal results are displayed) Labs Reviewed  CBC WITH DIFFERENTIAL/PLATELET - Abnormal; Notable for the following components:      Result Value   Neutro Abs 8.1 (*)    All other components within normal limits  BASIC METABOLIC PANEL - Abnormal; Notable for the following components:   Glucose, Bld 132 (*)    All other components within normal limits  D-DIMER, QUANTITATIVE  TROPONIN I (HIGH SENSITIVITY)  TROPONIN I (HIGH SENSITIVITY)     EKG EKG Interpretation  Date/Time:  Monday September 13 2020 20:33:56 EDT Ventricular Rate:  81 PR Interval:  162 QRS Duration: 76 QT Interval:  342 QTC Calculation: 397 R Axis:   69 Text Interpretation: Normal sinus rhythm Normal ECG No previous ECGs available Confirmed by [id number] [phone]) on 09/14/2020 1:09:42 AM   Radiology DG Chest 2 View  Result Date: 09/13/2020 CLINICAL DATA:  Painful respiration. Recent upper respiratory infection. EXAM: CHEST - 2 VIEW COMPARISON:  02/24/2015  FINDINGS: Heart size is normal. Mediastinal shadows are normal. There is central bronchial thickening but no infiltrate, collapse or effusion. Bony structures are normal. IMPRESSION: Bronchitis pattern. No consolidation or collapse. Electronically Signed   By: Paulina Fusi M.D.   On: 09/13/2020 20:50   CT Angio Chest PE W and/or Wo Contrast  Result Date: 09/14/2020 CLINICAL DATA:  PE suspected.  Shortness of breath with chest pain. EXAM: CT ANGIOGRAPHY CHEST WITH CONTRAST TECHNIQUE: Multidetector CT imaging of the chest was performed using the standard protocol during bolus administration of intravenous contrast. Multiplanar CT image reconstructions and MIPs were obtained to evaluate the vascular anatomy. CONTRAST:  OMNIPAQUE IOHEXOL 350 MG/ML SOLN COMPARISON:  None. FINDINGS: Cardiovascular: Contrast injection is sufficient to demonstrate satisfactory opacification of the pulmonary arteries to the segmental level. There is no pulmonary embolus or evidence of right heart strain. The size of the main pulmonary artery is normal. Heart size is normal, with no pericardial effusion. The course and caliber of the aorta are normal. There is no atherosclerotic calcification. Opacification decreased due to pulmonary arterial phase contrast bolus timing. Mediastinum/Nodes: -- No mediastinal lymphadenopathy. -- No hilar lymphadenopathy. -- No axillary lymphadenopathy. -- No supraclavicular lymphadenopathy. -- Normal  thyroid gland where visualized. -  Unremarkable esophagus. Lungs/Pleura: Airways are patent. No pleural effusion, lobar consolidation, pneumothorax or pulmonary infarction. Upper Abdomen: Contrast bolus timing is not optimized for evaluation of the abdominal organs. The visualized portions of the organs of the upper abdomen are normal. Musculoskeletal: No chest wall abnormality. No bony spinal canal stenosis. Review of the MIP images confirms the above findings. IMPRESSION: No evidence of pulmonary embolism or other acute intrathoracic process. Electronically Signed   By: Katherine Mantle M.D.   On: 09/14/2020 02:29    Procedures Procedures   Medications Ordered in ED Medications - No data to display  ED Course  I have reviewed the triage vital signs and the nursing notes.  Pertinent labs & imaging results that were available during my care of the patient were reviewed by me and considered in my medical decision making (see chart for details).    MDM Rules/Calculators/A&P                         Pleuritic chest pain since yesterday.  Not exertional.  EKG is sinus rhythm.  Chest x-ray shows bronchitis without infiltrate.  Troponin is negative.  Low suspicion for ACS.  Pulmonary embolism was considered given her history of portal vein thrombus no longer anticoagulation.  That is patient's main concern  CT is negative for pulmonary embolism or other acute intrathoracic process.  Troponin negative x2.  Low suspicion for ACS.  Suspect musculoskeletal chest pain versus pleurisy.  We will treat supportively with anti-inflammatories. Follow-up with PCP.  Return precautions discussed Final Clinical Impression(s) / ED Diagnoses Final diagnoses:  Pleuritic chest pain    Rx / DC Orders ED Discharge Orders    None       Shneur [place], Jeannett Senior, MD 09/14/20 [phone]

## 2022-04-25 ENCOUNTER — Encounter (HOSPITAL_BASED_OUTPATIENT_CLINIC_OR_DEPARTMENT_OTHER): Payer: Self-pay | Admitting: Obstetrics and Gynecology

## 2022-04-26 ENCOUNTER — Encounter (HOSPITAL_BASED_OUTPATIENT_CLINIC_OR_DEPARTMENT_OTHER): Payer: Self-pay | Admitting: Obstetrics and Gynecology

## 2022-04-26 NOTE — Progress Notes (Signed)
Spoke w/ via phone for pre-op interview--- pt Lab needs dos----   cbc, t&s, urine preg            Lab results------ no COVID test -----patient states asymptomatic no test needed Arrive at ------- 0945 on 04-27-2022 NPO after MN NO Solid Food.  Clear liquids from MN until--- 0845 Med rec completed Medications to take morning of surgery ----- none Diabetic medication ----- n/a Patient instructed no nail polish to be worn day of surgery Patient instructed to bring photo id and insurance card day of surgery Patient aware to have Driver (ride ) / caregiver for 24 hours after surgery --- husband, matthew Patient Special Instructions ----- pt stated was not given any instructions about ASA,  advised do not take morning of surgery Pre-Op special Istructions ----- n/a Patient verbalized understanding of instructions that were given at this phone interview. Patient denies shortness of breath, chest pain, fever, cough at this phone interview.

## 2022-04-27 ENCOUNTER — Ambulatory Visit (HOSPITAL_BASED_OUTPATIENT_CLINIC_OR_DEPARTMENT_OTHER)
Admission: RE | Admit: 2022-04-27 | Discharge: 2022-04-27 | Disposition: A | Discharge status: Home / Self Care | Payer: BC Managed Care – PPO | Attending: Obstetrics and Gynecology | Admitting: Obstetrics and Gynecology

## 2022-04-27 ENCOUNTER — Ambulatory Visit (HOSPITAL_BASED_OUTPATIENT_CLINIC_OR_DEPARTMENT_OTHER): Payer: BC Managed Care – PPO | Admitting: Anesthesiology

## 2022-04-27 ENCOUNTER — Encounter (HOSPITAL_BASED_OUTPATIENT_CLINIC_OR_DEPARTMENT_OTHER): Payer: Self-pay | Admitting: Obstetrics and Gynecology

## 2022-04-27 ENCOUNTER — Other Ambulatory Visit: Payer: Self-pay

## 2022-04-27 ENCOUNTER — Encounter (HOSPITAL_BASED_OUTPATIENT_CLINIC_OR_DEPARTMENT_OTHER)
Admission: RE | Disposition: A | Discharge status: Home / Self Care | Payer: Self-pay | Source: Home / Self Care | Attending: Obstetrics and Gynecology

## 2022-04-27 DIAGNOSIS — Z87891 Personal history of nicotine dependence: Secondary | ICD-10-CM | POA: Insufficient documentation

## 2022-04-27 DIAGNOSIS — N840 Polyp of corpus uteri: Secondary | ICD-10-CM | POA: Diagnosis present

## 2022-04-27 DIAGNOSIS — N939 Abnormal uterine and vaginal bleeding, unspecified: Secondary | ICD-10-CM | POA: Diagnosis not present

## 2022-04-27 DIAGNOSIS — Z01818 Encounter for other preprocedural examination: Secondary | ICD-10-CM

## 2022-04-27 HISTORY — PX: DILATATION & CURETTAGE/HYSTEROSCOPY WITH MYOSURE: SHX6511

## 2022-04-27 HISTORY — DX: Family history of other specified conditions: Z84.89

## 2022-04-27 HISTORY — DX: Personal history of colonic polyps: Z86.010

## 2022-04-27 HISTORY — DX: Personal history of adenomatous and serrated colon polyps: Z86.0101

## 2022-04-27 HISTORY — DX: Abnormal uterine and vaginal bleeding, unspecified: N93.9

## 2022-04-27 HISTORY — DX: Presence of spectacles and contact lenses: Z97.3

## 2022-04-27 LAB — CBC
HCT: 42.7 % (ref 36.0–46.0)
Hemoglobin: 13.3 g/dL (ref 12.0–15.0)
MCH: 27.8 pg (ref 26.0–34.0)
MCHC: 31.1 g/dL (ref 30.0–36.0)
MCV: 89.3 fL (ref 80.0–100.0)
Platelets: 182 10*3/uL (ref 150–400)
RBC: 4.78 MIL/uL (ref 3.87–5.11)
RDW: 13.9 % (ref 11.5–15.5)
WBC: 6.1 10*3/uL (ref 4.0–10.5)
nRBC: 0 % (ref 0.0–0.2)

## 2022-04-27 LAB — TYPE AND SCREEN
ABO/RH(D): O POS
Antibody Screen: NEGATIVE

## 2022-04-27 LAB — POCT PREGNANCY, URINE: Preg Test, Ur: NEGATIVE

## 2022-04-27 LAB — ABO/RH: ABO/RH(D): O POS

## 2022-04-27 SURGERY — DILATATION & CURETTAGE/HYSTEROSCOPY WITH MYOSURE
Anesthesia: General | Site: Vagina

## 2022-04-27 MED ORDER — DEXAMETHASONE SODIUM PHOSPHATE 10 MG/ML IJ SOLN
INTRAMUSCULAR | Status: DC | PRN
  Administered 2022-04-27: 10 mg via INTRAVENOUS

## 2022-04-27 MED ORDER — SODIUM CHLORIDE 0.9 % IR SOLN
Status: DC | PRN
  Administered 2022-04-27: 3000 mL

## 2022-04-27 MED ORDER — ACETAMINOPHEN 500 MG PO TABS
ORAL_TABLET | ORAL | Status: AC
  Filled 2022-04-27: qty 2

## 2022-04-27 MED ORDER — MIDAZOLAM HCL 2 MG/2ML IJ SOLN
INTRAMUSCULAR | Status: AC
  Filled 2022-04-27: qty 2

## 2022-04-27 MED ORDER — FENTANYL CITRATE (PF) 100 MCG/2ML IJ SOLN
INTRAMUSCULAR | Status: AC
  Filled 2022-04-27: qty 2

## 2022-04-27 MED ORDER — PROPOFOL 10 MG/ML IV BOLUS
INTRAVENOUS | Status: DC | PRN
  Administered 2022-04-27: 180 mg via INTRAVENOUS

## 2022-04-27 MED ORDER — ONDANSETRON HCL 4 MG/2ML IJ SOLN
INTRAMUSCULAR | Status: DC | PRN
  Administered 2022-04-27: 4 mg via INTRAVENOUS

## 2022-04-27 MED ORDER — LIDOCAINE 2% (20 MG/ML) 5 ML SYRINGE
INTRAMUSCULAR | Status: DC | PRN
  Administered 2022-04-27: 50 mg via INTRAVENOUS

## 2022-04-27 MED ORDER — PROPOFOL 10 MG/ML IV BOLUS
INTRAVENOUS | Status: AC
  Filled 2022-04-27: qty 20

## 2022-04-27 MED ORDER — MIDAZOLAM HCL 2 MG/2ML IJ SOLN
INTRAMUSCULAR | Status: DC | PRN
  Administered 2022-04-27: 2 mg via INTRAVENOUS

## 2022-04-27 MED ORDER — ACETAMINOPHEN 325 MG PO TABS: 650.0000 mg | ORAL_TABLET | Freq: Four times a day (QID) | ORAL | Status: AC | PRN

## 2022-04-27 MED ORDER — EPHEDRINE SULFATE-NACL 50-0.9 MG/10ML-% IV SOSY
PREFILLED_SYRINGE | INTRAVENOUS | Status: DC | PRN
  Administered 2022-04-27: 10 mg via INTRAVENOUS

## 2022-04-27 MED ORDER — ACETAMINOPHEN 500 MG PO TABS
1000.0000 mg | ORAL_TABLET | ORAL | Status: AC
  Administered 2022-04-27: 1000 mg via ORAL

## 2022-04-27 MED ORDER — FENTANYL CITRATE (PF) 100 MCG/2ML IJ SOLN
INTRAMUSCULAR | Status: DC | PRN
  Administered 2022-04-27 (×2): 50 ug via INTRAVENOUS

## 2022-04-27 MED ORDER — LACTATED RINGERS IV SOLN: INTRAVENOUS | Status: DC

## 2022-04-27 MED ORDER — KETOROLAC TROMETHAMINE 30 MG/ML IJ SOLN
INTRAMUSCULAR | Status: DC | PRN
  Administered 2022-04-27: 30 mg via INTRAVENOUS

## 2022-04-27 MED ORDER — IBUPROFEN 200 MG PO TABS: 600.0000 mg | ORAL_TABLET | Freq: Four times a day (QID) | ORAL | Status: AC | PRN

## 2022-04-27 MED ORDER — FENTANYL CITRATE (PF) 100 MCG/2ML IJ SOLN: 25.0000 ug | INTRAMUSCULAR | Status: DC | PRN

## 2022-04-27 MED ORDER — LIDOCAINE HCL (PF) 2 % IJ SOLN
INTRAMUSCULAR | Status: AC
  Filled 2022-04-27: qty 5

## 2022-04-27 MED ORDER — LIDOCAINE HCL 1 % IJ SOLN
INTRAMUSCULAR | Status: DC | PRN
  Administered 2022-04-27: 10 mL

## 2022-04-27 SURGICAL SUPPLY — 16 items
CATH ROBINSON RED A/P 16FR (CATHETERS) ×1 IMPLANT
DEVICE MYOSURE LITE (MISCELLANEOUS) IMPLANT
DEVICE MYOSURE REACH (MISCELLANEOUS) IMPLANT
DRSG TELFA 3X8 NADH STRL (GAUZE/BANDAGES/DRESSINGS) ×1 IMPLANT
GAUZE 4X4 16PLY ~~LOC~~+RFID DBL (SPONGE) ×1 IMPLANT
GLOVE BIO SURGEON STRL SZ 6 (GLOVE) ×1 IMPLANT
GLOVE BIOGEL PI IND STRL 6.5 (GLOVE) ×1 IMPLANT
GOWN STRL REUS W/ TWL LRG LVL3 (GOWN DISPOSABLE) ×1 IMPLANT
GOWN STRL REUS W/TWL LRG LVL3 (GOWN DISPOSABLE) ×1
KIT PROCEDURE FLUENT (KITS) ×1 IMPLANT
KIT TURNOVER CYSTO (KITS) ×1 IMPLANT
PACK VAGINAL MINOR WOMEN LF (CUSTOM PROCEDURE TRAY) ×1 IMPLANT
PAD OB MATERNITY 4.3X12.25 (PERSONAL CARE ITEMS) ×1 IMPLANT
SEAL ROD LENS SCOPE MYOSURE (ABLATOR) ×1 IMPLANT
TOWEL OR 17X26 10 PK STRL BLUE (TOWEL DISPOSABLE) ×1 IMPLANT
UNDERPAD 30X36 HEAVY ABSORB (UNDERPADS AND DIAPERS) ×1 IMPLANT

## 2022-04-27 NOTE — Discharge Instructions (Signed)
° °  D & C Home care Instructions: ° ° °Personal hygiene:  Used sanitary napkins for vaginal drainage not tampons. Shower or tub bathe the day after your procedure. No douching until bleeding stops. Always wipe from front to back after  Elimination. ° °Activity: Do not drive or operate any equipment today. The effects of the anesthesia are still present and drowsiness may result. Rest today, not necessarily flat bed rest, just take it easy. You may resume your normal activity in one to 2 days. ° °Sexual activity: No intercourse for one week or as indicated by your physician ° °Diet: Eat a light diet as desired this evening. You may resume a regular diet tomorrow. ° °Return to work: One to 2 days. ° °General Expectations of your surgery: Vaginal bleeding should be no heavier than a normal period. Spotting may continue up to 10 days. Mild cramps may continue for a couple of days. You may have a regular period in 2-6 weeks. ° °Unexpected observations call your doctor if these occur: persistent or heavy bleeding. Severe abdominal cramping or pain. Elevation of temperature greater than 100°F. ° °Call for an appointment in one week. ° ° °Post Anesthesia Home Care Instructions ° °Activity: °Get plenty of rest for the remainder of the day. A responsible individual must stay with you for 24 hours following the procedure.  °For the next 24 hours, DO NOT: °-Drive a car °-Operate machinery °-Drink alcoholic beverages °-Take any medication unless instructed by your physician °-Make any legal decisions or sign important papers. ° °Meals: °Start with liquid foods such as gelatin or soup. Progress to regular foods as tolerated. Avoid greasy, spicy, heavy foods. If nausea and/or vomiting occur, drink only clear liquids until the nausea and/or vomiting subsides. Call your physician if vomiting continues. ° °Special Instructions/Symptoms: °Your throat may feel dry or sore from the anesthesia or the breathing tube placed in your throat  during surgery. If this causes discomfort, gargle with warm salt water. The discomfort should disappear within 24 hours. ° °If you had a scopolamine patch placed behind your ear for the management of post- operative nausea and/or vomiting: ° °1. The medication in the patch is effective for 72 hours, after which it should be removed.  Wrap patch in a tissue and discard in the trash. Wash hands thoroughly with soap and water. °2. You may remove the patch earlier than 72 hours if you experience unpleasant side effects which may include dry mouth, dizziness or visual disturbances. °3. Avoid touching the patch. Wash your hands with soap and water after contact with the patch. °  ° °

## 2022-04-27 NOTE — Anesthesia Postprocedure Evaluation (Signed)
Anesthesia Post Note  Patient: Kathy Wu  Procedure(s) Performed: DILATATION & CURETTAGE/HYSTEROSCOPY WITH MYOSURE LITE (Vagina )     Patient location during evaluation: PACU Anesthesia Type: General Level of consciousness: awake Pain management: pain level controlled Vital Signs Assessment: post-procedure vital signs reviewed and stable Respiratory status: spontaneous breathing Cardiovascular status: stable Postop Assessment: no apparent nausea or vomiting Anesthetic complications: no   No notable events documented.  Last Vitals:  Vitals:   04/27/22 1223 04/27/22 1230  BP: (!) 146/104 124/83  Pulse: 96 93  Resp: 11 (!) 21  Temp: 37.1 C   SpO2: 95% 97%    Last Pain:  Vitals:   04/27/22 1230  TempSrc:   PainSc: 3                  Marshel Golubski

## 2022-04-27 NOTE — Transfer of Care (Signed)
Immediate Anesthesia Transfer of Care Note  Patient: Kathy Wu  Procedure(s) Performed: DILATATION & CURETTAGE/HYSTEROSCOPY WITH MYOSURE LITE (Vagina )  Patient Location: PACU  Anesthesia Type:General  Level of Consciousness: awake, alert , and oriented  Airway & Oxygen Therapy: Patient Spontanous Breathing  Post-op Assessment: Report given to RN and Post -op Vital signs reviewed and stable  Post vital signs: Reviewed and stable  Last Vitals:  Vitals Value Taken Time  BP 146/104 04/27/22 1223  Temp    Pulse 94 04/27/22 1225  Resp 10 04/27/22 1225  SpO2 97 % 04/27/22 1225  Vitals shown include unvalidated device data.  Last Pain:  Vitals:   04/27/22 1016  TempSrc: Oral  PainSc: 0-No pain      Patients Stated Pain Goal: 5 (04/27/22 1016)  Complications: No notable events documented.

## 2022-04-27 NOTE — Addendum Note (Signed)
Addendum  created 04/27/22 1248 by Norva Pavlov, CRNA   Attestation recorded in Intraprocedure, Clinical Note Signed, Flowsheet accepted, Intraprocedure Attestations filed, Intraprocedure Blocks edited, Intraprocedure Meds edited

## 2022-04-27 NOTE — H&P (Signed)
Kathy Wu is an 46 y.o. female P1 with AUB, endometrial polyp.    12/26/21 visit "regular monthly, 2nd day is heaviest, changing ever 2.5 hours intermenstrual bleeding with clots - ongoing problem" 02/24/22 SIS, then EMB done for AUB, suspected endometrial polyp. Patient tolerated well.  Reviewed GYN [place]: 6.6cm anteverted uterus with 5.25mm endometrial stripe. After instillation of saline into uterine cavity, 1.5cm polyp visualized along anterior wall. Normal ovaries bilaterally.  EMB: PROLIFERATIVE ENDOMETRIUM. NO HYPERPLASIA OR CARCINOMA.   Menstrual History:  Patient's last menstrual period was 04/01/2022 (exact date).    Past Medical History:  Diagnosis Date   Abnormal uterine bleeding (AUB)    Family history of adverse reaction to anesthesia    per pt her paternal grandfather's sister ,  passed away intraoperatively as a child approx 1940s,  thinks she given to much   History of adenomatous polyp of colon    History of deep vein thrombosis 07/2012   of port vein 07-21-2012  post op 1 week laparoscopy ;  followed by dr Cyndie Chime,  completed 6 months coumadin,  negative clotting disorder work-up,  FH provoked DVT;   recurrent 03/ 2015 but no anticoagulation due to complete recanalization of portal vein  per CT   Wears glasses     Past Surgical History:  Procedure Laterality Date   CHOLECYSTECTOMY, LAPAROSCOPIC  11/22/2010   @[facility]  by dr wyatt   COLONOSCOPY  12/2019   LAPAROSCOPY N/A 07/19/2012   Procedure: LAPAROSCOPY DIAGNOSTIC;  Surgeon: 09/16/2012, MD;  Location: [facility];  Service: Gynecology;  Laterality: N/A;   SHOULDER ARTHROSCOPY WITH OPEN ROTATOR CUFF REPAIR Left 2018   VAGINA SURGERY  08/04/2013   extensive vaginal mucosa biopsy    Family History  Problem Relation Age of Onset   Hypertension Mother     Social History:  reports that she quit smoking about 22 years ago. Her smoking use included cigarettes. She has never used smokeless tobacco. She reports  current alcohol use. She reports that she does not use drugs.  Allergies: No Known Allergies  Medications Prior to Admission  Medication Sig Dispense Refill Last Dose   aspirin 81 MG tablet Take 81 mg by mouth daily.   Past Week    Review of Systems  Constitutional:  Negative for fever.  HENT:  Negative for sore throat.   Respiratory:  Negative for shortness of breath.   Cardiovascular:  Negative for chest pain.  Gastrointestinal:  Negative for abdominal pain.  Genitourinary:  Positive for menstrual problem.  Musculoskeletal:  Negative for arthralgias.  Skin:  Negative for rash.  Neurological:  Negative for headaches.  Psychiatric/Behavioral:  Negative for suicidal ideas.     Blood pressure 123/78, pulse 62, temperature 97.8 F (36.6 C), temperature source Oral, height 5' 3.5" (1.613 m), weight 62.2 kg, last menstrual period 04/01/2022, SpO2 (!) 17 %. Physical Exam Chaperone Chaperone: present  Constitutional General Appearance: healthy-appearing, well-nourished, well-developed  Psychiatric Orientation: to time, to place, to person Mood and Affect: active and alert, normal mood, normal affect  Abdomen Auscultation/Inspection/Palpation: normal bowel sounds, soft, non-distended, no tenderness, no hepatomegaly, no splenomegaly, no masses, no CVA tenderness Hernia: none palpated  Female Genitalia Vulva: no masses, no atrophy, no lesions Bladder/Urethra: normal meatus, no urethral discharge, no urethral mass, bladder non distended Vagina no tenderness, no erythema, no abnormal vaginal discharge, no vesicle(s) or ulcers, no cystocele, no rectocele Cervix: grossly normal, no discharge, no cervical motion tenderness Uterus: normal size, normal shape, midline, mobile, non-tender, no  uterine prolapse Adnexa/Parametria: no parametrial tenderness, no parametrial mass, no adnexal tenderness, no ovarian mass  Results for orders placed or performed during the hospital encounter of  04/27/22 (from the past 24 hour(s))  Pregnancy, urine POC     Status: None   Collection Time: 04/27/22  9:57 AM  Result Value Ref Range   Preg Test, Ur NEGATIVE NEGATIVE  CBC     Status: None   Collection Time: 04/27/22 10:48 AM  Result Value Ref Range   WBC 6.1 4.0 - 10.5 K/uL   RBC 4.78 3.87 - 5.11 MIL/uL   Hemoglobin 13.3 12.0 - 15.0 g/dL   HCT 09.3 26.7 - 12.4 %   MCV 89.3 80.0 - 100.0 fL   MCH 27.8 26.0 - 34.0 pg   MCHC 31.1 30.0 - 36.0 g/dL   RDW 58.0 99.8 - 33.8 %   Platelets 182 150 - 400 K/uL   nRBC 0.0 0.0 - 0.2 %    No results found.  Assessment/Plan: 46Y P1 with AUB, suspected endometrial polyp - Plan: hysteroscopic polypectomy, dilation and curettage - Informed consent obtained. Reviewed risk of infection, bleeding, uterine perforation, damage to surrounding organs, failure to achieve desired result. All questions answered.   Charlett Nose 04/27/2022, 11:18 AM

## 2022-04-27 NOTE — Brief Op Note (Signed)
04/27/2022  12:20 PM  PATIENT:  Kathy Wu  46 y.o. female  PRE-OPERATIVE DIAGNOSIS:  abnormal uterine bleeding, suspected endometrial polyp  POST-OPERATIVE DIAGNOSIS:  abnormal uterine bleeding, endometrial polyps  PROCEDURE:  Hysteroscopic polypectomy, dilation and curettage  SURGEON:  Surgeon(s) and Role:    * Charlett Nose, MD - Primary  ANESTHESIA:   local and general  EBL:  5 mL   BLOOD ADMINISTERED:none  DRAINS: none   LOCAL MEDICATIONS USED:  LIDOCAINE  and Amount: 10 ml  SPECIMEN:  Source of Specimen:  endometrial polyps and curettings  DISPOSITION OF SPECIMEN:  PATHOLOGY  COUNTS:  YES  TOURNIQUET:  * No tourniquets in log *  DICTATION: .Note written in EPIC  PLAN OF CARE: Discharge to home after PACU  PATIENT DISPOSITION:  PACU - hemodynamically stable.   Delay start of Pharmacological VTE agent (>24hrs) due to surgical blood loss or risk of bleeding: not applicable

## 2022-04-27 NOTE — Anesthesia Procedure Notes (Addendum)
Procedure Name: LMA Insertion Date/Time: 04/27/2022 11:52 AM  Performed by: Norva Pavlov, CRNAPre-anesthesia Checklist: Patient identified, Emergency Drugs available, Suction available and Patient being monitored Patient Re-evaluated:Patient Re-evaluated prior to induction Oxygen Delivery Method: Circle system utilized Preoxygenation: Pre-oxygenation with 100% oxygen Induction Type: IV induction Ventilation: Mask ventilation without difficulty LMA: LMA inserted LMA Size: 4.0 Number of attempts: 1 Airway Equipment and Method: Bite block Placement Confirmation: positive ETCO2 Tube secured with: Tape Dental Injury: Teeth and Oropharynx as per pre-operative assessment

## 2022-04-27 NOTE — Op Note (Signed)
04/27/2022   12:20 PM   PATIENT:  Kathy Wu  46 y.o. female   PRE-OPERATIVE DIAGNOSIS:  abnormal uterine bleeding, suspected endometrial polyp   POST-OPERATIVE DIAGNOSIS:  abnormal uterine bleeding, endometrial polyps   PROCEDURE:  Hysteroscopic polypectomy, dilation and curettage   SURGEON:  Surgeon(s) and Role:    * Charlett Nose, MD - Primary   ANESTHESIA:   local and general   EBL:  5 mL    BLOOD ADMINISTERED:none   DRAINS: none    LOCAL MEDICATIONS USED:  LIDOCAINE  and Amount: 10 ml   SPECIMEN:  Source of Specimen:  endometrial polyps and curettings   DISPOSITION OF SPECIMEN:  PATHOLOGY   COUNTS:  YES   PLAN OF CARE: Discharge to home after PACU   PATIENT DISPOSITION:  PACU - hemodynamically stable.  COMPLICATIONS: none  FINDINGS: anteverted uterus sounded to 8cm. On hysterscopic view, bilateral ostia visualized. 4 endometrial polyps noted with stalks from anterior uterine wall. Otherwise fluffy pink appearing endometrium. Total fluid deficit .  PROCEDURE IN DETAIL:  The patient was appropriately consented and taken to the operating room where anesthesia was administered without difficulty. Thromboguards were placed and connected. She was placed in the dorsal lithotomy position in stirrups. She was examined under anesthesia and found to have an anteverted uterus. The patient was then prepped and draped in normal sterile fashion. A long speculum was inserted into the vagina. A single-tooth tenaculum was used to grasp the anterior lip of the cervix. A paracervical block was obtained by injecting a total of 6mL of 1% lidocaine at the 4 and 8 o'clock positions at the cervicovaginal junction. The uterus was sounded to 8cm. The cervical os was sequentially dilated using Pratt dilators to accommodate the hysteroscope. The hysteroscope was introduced under direct visualization and the uterus was distended with normal saline. Bilateral ostia were visualized.  Findings as noted above. The Myosure Lite device was used to resect the polyps. Specimen was collected for pathology. Hemostasis was noted in the uterine cavity. The hysteroscope was removed. A sharp curette was inserted,  endometrial curettings were collected on telfa and sent for pathology.  The tenaculum was removed from the cervix and good hemostasis was noted at the puncture sites. The patient tolerated the procedure well. The instrument and sponge counts were correct times two. The patient was awakened from anesthesia and taken to the recovery room in stable condition.  Derl Barrow, MD 04/27/22 12:23 PM

## 2022-04-27 NOTE — Anesthesia Preprocedure Evaluation (Addendum)
Anesthesia Evaluation  Patient identified by MRN, date of birth, ID bandGeneral Assessment Comment:History noted Dr. Chilton Si  Reviewed: Allergy & Precautions, NPO status , Patient's Chart, lab work & pertinent test results  Airway Mallampati: II       Dental   Pulmonary former smoker   breath sounds clear to auscultation       Cardiovascular negative cardio ROS  Rhythm:Regular Rate:Normal     Neuro/Psych    GI/Hepatic negative GI ROS, Neg liver ROS,,,  Endo/Other    Renal/GU negative Renal ROS     Musculoskeletal   Abdominal   Peds  Hematology   Anesthesia Other Findings   Reproductive/Obstetrics                             Anesthesia Physical Anesthesia Plan  ASA: 2  Anesthesia Plan: General   Post-op Pain Management: Tylenol PO (pre-op)*   Induction: Intravenous  PONV Risk Score and Plan: 3 and Ondansetron  Airway Management Planned: LMA  Additional Equipment:   Intra-op Plan:   Post-operative Plan: Extubation in OR  Informed Consent: I have reviewed the patients History and Physical, chart, labs and discussed the procedure including the risks, benefits and alternatives for the proposed anesthesia with the patient or authorized representative who has indicated his/her understanding and acceptance.     Dental advisory given  Plan Discussed with: CRNA and Anesthesiologist  Anesthesia Plan Comments:        Anesthesia Quick Evaluation

## 2022-04-28 ENCOUNTER — Encounter (HOSPITAL_BASED_OUTPATIENT_CLINIC_OR_DEPARTMENT_OTHER): Payer: Self-pay | Admitting: Obstetrics and Gynecology

## 2022-04-28 LAB — SURGICAL PATHOLOGY

## 2022-08-04 ENCOUNTER — Other Ambulatory Visit (HOSPITAL_BASED_OUTPATIENT_CLINIC_OR_DEPARTMENT_OTHER): Payer: Self-pay | Admitting: Obstetrics and Gynecology

## 2022-08-04 DIAGNOSIS — N939 Abnormal uterine and vaginal bleeding, unspecified: Secondary | ICD-10-CM

## 2022-08-07 ENCOUNTER — Ambulatory Visit (HOSPITAL_BASED_OUTPATIENT_CLINIC_OR_DEPARTMENT_OTHER)
Admission: RE | Admit: 2022-08-07 | Discharge: 2022-08-07 | Disposition: A | Discharge status: Home / Self Care | Payer: BC Managed Care – PPO | Source: Ambulatory Visit | Attending: Obstetrics and Gynecology | Admitting: Obstetrics and Gynecology

## 2022-08-07 DIAGNOSIS — N939 Abnormal uterine and vaginal bleeding, unspecified: Secondary | ICD-10-CM | POA: Diagnosis present
